# Patient Record
Sex: Female | Born: 1970 | Race: White | Hispanic: No | Marital: Single | Smoking: Current every day smoker
Health system: Southern US, Community
[De-identification: ages and names within clinical notes are randomized; demographics above are authoritative.]

## PROBLEM LIST (undated history)

## (undated) DIAGNOSIS — F329 Major depressive disorder, single episode, unspecified: Secondary | ICD-10-CM

## (undated) DIAGNOSIS — C569 Malignant neoplasm of unspecified ovary: Secondary | ICD-10-CM

## (undated) DIAGNOSIS — F32A Depression, unspecified: Secondary | ICD-10-CM

## (undated) DIAGNOSIS — F419 Anxiety disorder, unspecified: Secondary | ICD-10-CM

## (undated) DIAGNOSIS — F99 Mental disorder, not otherwise specified: Secondary | ICD-10-CM

## (undated) DIAGNOSIS — R12 Heartburn: Secondary | ICD-10-CM

## (undated) DIAGNOSIS — O24419 Gestational diabetes mellitus in pregnancy, unspecified control: Secondary | ICD-10-CM

## (undated) HISTORY — DX: Heartburn: R12

## (undated) HISTORY — DX: Depression, unspecified: F32.A

## (undated) HISTORY — DX: Malignant neoplasm of unspecified ovary: C56.9

## (undated) HISTORY — DX: Major depressive disorder, single episode, unspecified: F32.9

## (undated) HISTORY — DX: Gestational diabetes mellitus in pregnancy, unspecified control: O24.419

---

## 1989-10-25 HISTORY — PX: CERVICAL FUSION: SHX112

## 1992-10-25 DIAGNOSIS — C569 Malignant neoplasm of unspecified ovary: Secondary | ICD-10-CM

## 1992-10-25 HISTORY — DX: Malignant neoplasm of unspecified ovary: C56.9

## 1992-10-25 HISTORY — PX: OVARY SURGERY: SHX727

## 2001-07-07 ENCOUNTER — Encounter: Payer: Self-pay | Admitting: Neurosurgery

## 2001-07-07 ENCOUNTER — Ambulatory Visit (HOSPITAL_COMMUNITY): Admission: RE | Admit: 2001-07-07 | Discharge: 2001-07-07 | Payer: Self-pay | Admitting: Neurosurgery

## 2001-08-22 ENCOUNTER — Encounter: Payer: Self-pay | Admitting: Neurosurgery

## 2001-08-22 ENCOUNTER — Ambulatory Visit (HOSPITAL_COMMUNITY): Admission: RE | Admit: 2001-08-22 | Discharge: 2001-08-22 | Payer: Self-pay | Admitting: Neurosurgery

## 2002-01-01 ENCOUNTER — Encounter: Payer: Self-pay | Admitting: Neurosurgery

## 2002-01-01 ENCOUNTER — Ambulatory Visit (HOSPITAL_COMMUNITY): Admission: RE | Admit: 2002-01-01 | Discharge: 2002-01-01 | Payer: Self-pay | Admitting: Neurosurgery

## 2002-05-23 ENCOUNTER — Emergency Department (HOSPITAL_COMMUNITY): Admission: EM | Admit: 2002-05-23 | Discharge: 2002-05-23 | Payer: Self-pay | Admitting: Emergency Medicine

## 2004-12-24 ENCOUNTER — Ambulatory Visit: Payer: Self-pay | Admitting: Family Medicine

## 2005-02-15 ENCOUNTER — Ambulatory Visit: Payer: Self-pay | Admitting: Family Medicine

## 2005-02-23 ENCOUNTER — Ambulatory Visit: Payer: Self-pay | Admitting: Family Medicine

## 2007-03-09 ENCOUNTER — Ambulatory Visit: Payer: Self-pay | Admitting: Family Medicine

## 2012-12-27 ENCOUNTER — Ambulatory Visit (HOSPITAL_COMMUNITY): Admission: RE | Admit: 2012-12-27 | Payer: BC Managed Care – PPO | Source: Home / Self Care | Admitting: Psychiatry

## 2012-12-27 ENCOUNTER — Ambulatory Visit (INDEPENDENT_AMBULATORY_CARE_PROVIDER_SITE_OTHER): Payer: BC Managed Care – PPO | Admitting: Physician Assistant

## 2012-12-27 DIAGNOSIS — F331 Major depressive disorder, recurrent, moderate: Secondary | ICD-10-CM

## 2012-12-27 MED ORDER — TRAZODONE HCL 50 MG PO TABS: 50.0000 mg | ORAL_TABLET | Freq: Every day | ORAL | Status: DC

## 2012-12-27 NOTE — BHH Counselor (Signed)
Pt presented to [facility] for an assessment but left before being assessed.

## 2013-01-01 ENCOUNTER — Encounter (HOSPITAL_COMMUNITY): Payer: Self-pay | Admitting: *Deleted

## 2013-01-01 ENCOUNTER — Ambulatory Visit (HOSPITAL_COMMUNITY)
Admission: RE | Admit: 2013-01-01 | Discharge: 2013-01-01 | Disposition: A | Discharge status: Home / Self Care | Payer: BC Managed Care – PPO | Source: Ambulatory Visit | Attending: Psychiatry | Admitting: Psychiatry

## 2013-01-01 HISTORY — DX: Anxiety disorder, unspecified: F41.9

## 2013-01-01 HISTORY — DX: Mental disorder, not otherwise specified: F99
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
## 2013-01-16 ENCOUNTER — Other Ambulatory Visit (HOSPITAL_COMMUNITY): Payer: Self-pay | Admitting: Psychiatry

## 2013-01-16 ENCOUNTER — Encounter (HOSPITAL_COMMUNITY): Payer: Self-pay

## 2013-01-16 ENCOUNTER — Other Ambulatory Visit (HOSPITAL_COMMUNITY): Payer: BC Managed Care – PPO | Attending: Psychiatry | Admitting: Psychiatry

## 2013-01-16 DIAGNOSIS — F329 Major depressive disorder, single episode, unspecified: Secondary | ICD-10-CM

## 2013-01-16 DIAGNOSIS — F4310 Post-traumatic stress disorder, unspecified: Secondary | ICD-10-CM

## 2013-01-16 DIAGNOSIS — F32A Depression, unspecified: Secondary | ICD-10-CM

## 2013-01-16 DIAGNOSIS — F411 Generalized anxiety disorder: Secondary | ICD-10-CM

## 2013-01-16 DIAGNOSIS — F332 Major depressive disorder, recurrent severe without psychotic features: Secondary | ICD-10-CM | POA: Insufficient documentation

## 2013-01-16 MED ORDER — MIRTAZAPINE 15 MG PO TABS: 15.0000 mg | ORAL_TABLET | Freq: Every day | ORAL | Status: DC
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
## 2013-01-16 NOTE — Telephone Encounter (Signed)
Pt phoned and stated that pharmacy wouldn't fill Remeron due to wrong address on the script.  This Clinical research associate called in Remeron 15 mg at hs #30 with no refill to pt's pharmacy.

## 2013-01-17 ENCOUNTER — Other Ambulatory Visit (HOSPITAL_COMMUNITY): Payer: BC Managed Care – PPO | Admitting: Psychiatry

## 2013-01-17 ENCOUNTER — Other Ambulatory Visit (HOSPITAL_COMMUNITY): Payer: Self-pay | Admitting: Psychiatry

## 2013-01-17 DIAGNOSIS — F329 Major depressive disorder, single episode, unspecified: Secondary | ICD-10-CM

## 2013-01-17 DIAGNOSIS — F32A Depression, unspecified: Secondary | ICD-10-CM | POA: Insufficient documentation

## 2013-01-17 NOTE — Progress Notes (Signed)
    Daily Group Progress Note  Program: IOP  Group Time: 9:00-10:30 am   Participation Level: Active  Behavioral Response: Appropriate  Type of Therapy:  Process Group  Summary of Progress: Today was patients first day in the group. She appeared nervous but attentive.      Group Time: 10:30 am - 12:00 pm   Participation Level:  Active  Behavioral Response: Appropriate  Type of Therapy: Psycho-education Group  Summary of Progress: Patient participated in an activity to reduce anxiety and practiced Progressive Muscle Relaxation to reduce anxiety symptoms.   Carman Ching, LCSW

## 2013-01-18 ENCOUNTER — Other Ambulatory Visit (HOSPITAL_COMMUNITY): Payer: BC Managed Care – PPO | Admitting: Psychiatry

## 2013-01-18 DIAGNOSIS — F32A Depression, unspecified: Secondary | ICD-10-CM

## 2013-01-18 DIAGNOSIS — F329 Major depressive disorder, single episode, unspecified: Secondary | ICD-10-CM
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
## 2013-01-18 NOTE — Progress Notes (Signed)
    Daily Group Progress Note  Program: IOP  Group Time: 9:00-10:30 am   Participation Level: Active  Behavioral Response: Appropriate  Type of Therapy:  Process Group  Summary of Progress: Patient reports high depression today. She appeared disengaged and was quiet. She appeared to be focused on her own pain and in her own thoughts. She required redirection to join with the group.      Group Time: 10:30 am - 12:00 pm   Participation Level:  Minimal  Behavioral Response: Appropriate  Type of Therapy: Psycho-education Group  Summary of Progress: Patient learned the importance of having a daily wellness list of activities to use to maintain wellness and was introduced to the [employer] list activity.   Carman Ching, LCSW

## 2013-01-19 ENCOUNTER — Other Ambulatory Visit (HOSPITAL_COMMUNITY): Payer: BC Managed Care – PPO | Admitting: Psychiatry

## 2013-01-19 ENCOUNTER — Encounter (HOSPITAL_COMMUNITY): Payer: Self-pay | Admitting: Physician Assistant

## 2013-01-19 DIAGNOSIS — F32A Depression, unspecified: Secondary | ICD-10-CM

## 2013-01-19 DIAGNOSIS — F329 Major depressive disorder, single episode, unspecified: Secondary | ICD-10-CM
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
## 2013-01-22 ENCOUNTER — Other Ambulatory Visit (HOSPITAL_COMMUNITY): Payer: BC Managed Care – PPO | Admitting: Psychiatry

## 2013-01-22 DIAGNOSIS — F32A Depression, unspecified: Secondary | ICD-10-CM

## 2013-01-22 DIAGNOSIS — F329 Major depressive disorder, single episode, unspecified: Secondary | ICD-10-CM

## 2013-01-22 NOTE — Progress Notes (Signed)
    Daily Group Progress Note  Program: IOP  Group Time: 9:00-10:30 am   Participation Level: Minimal  Behavioral Response: Resistant and Disruptive  Type of Therapy:  Process Group  Summary of Progress: Patient interrupted another group member who was in the middle of sharing and lacked insight into this disruption. She shared that she has something from her past that others keep telling her she needs to share but she does not want to. Writer redirected her from using passive aggressive communication and modeled appropriate communication and group social skills. Patient was encouraged to share what and when she is ready.      Group Time: 10:30 am - 12:00 pm   Participation Level:  Active  Behavioral Response: Appropriate  Type of Therapy: Psycho-education Group  Summary of Progress: Patient participated in a group with a focus on grief and loss and identified and shared losses impacting overall wellness.   Carman Ching, LCSW

## 2013-01-23 ENCOUNTER — Other Ambulatory Visit (HOSPITAL_COMMUNITY): Payer: BC Managed Care – PPO | Attending: Psychiatry | Admitting: Psychiatry

## 2013-01-23 DIAGNOSIS — F329 Major depressive disorder, single episode, unspecified: Secondary | ICD-10-CM

## 2013-01-23 DIAGNOSIS — F32A Depression, unspecified: Secondary | ICD-10-CM

## 2013-01-23 DIAGNOSIS — F331 Major depressive disorder, recurrent, moderate: Secondary | ICD-10-CM | POA: Insufficient documentation

## 2013-01-23 DIAGNOSIS — F411 Generalized anxiety disorder: Secondary | ICD-10-CM | POA: Insufficient documentation

## 2013-01-24 ENCOUNTER — Other Ambulatory Visit (HOSPITAL_COMMUNITY): Payer: BC Managed Care – PPO | Admitting: Psychiatry

## 2013-01-24 DIAGNOSIS — F329 Major depressive disorder, single episode, unspecified: Secondary | ICD-10-CM

## 2013-01-24 DIAGNOSIS — F32A Depression, unspecified: Secondary | ICD-10-CM
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
## 2013-01-24 NOTE — Progress Notes (Signed)
    Daily Group Progress Note  Program: IOP  Group Time: 9:00-10:30 am   Participation Level: Active  Behavioral Response: Appropriate  Type of Therapy:  Process Group  Summary of Progress: patient was introduced to how to use progressive muscle relaxation to manage anxiety and depression and patient then practiced how to use the intervention through practice and implementation.      Group Time: 10:30 am - 12:00 pm   Participation Level:  Active  Behavioral Response: Appropriate  Type of Therapy: Psycho-education Group  Summary of Progress: patient was introduced to how to use progressive muscle relaxation to manage anxiety and depression and patient then practiced how to use the intervention through practice and implementation.   Obrien Huskins E, LCSW 

## 2013-01-24 NOTE — Progress Notes (Signed)
    Daily Group Progress Note  Program: IOP  Group Time: 9:00-10:30 am   Participation Level: Active  Behavioral Response: Appropriate  Type of Therapy:  Process Group  Summary of Progress: Patient is wearing makeup and is dressed up with her hair styled. She was smiling and reports feeling "good" today after sharing yesterday and the group and releasing some of her stress with others her supported her. She said she hopes to continue working on trauma from her past. She also described how she is practicing setting healthy boundaries with her family.      Group Time: 10:30 am - 12:00 pm   Participation Level:  Active  Behavioral Response: Appropriate  Type of Therapy: Psycho-education Group  Summary of Progress: Patient participated in a discussion on support groups available through the [facility] that can be continued as part of patients wellness plan.   Carman Ching, LCSW

## 2013-01-25 ENCOUNTER — Other Ambulatory Visit (HOSPITAL_COMMUNITY): Payer: BC Managed Care – PPO

## 2013-01-26 ENCOUNTER — Other Ambulatory Visit (HOSPITAL_COMMUNITY): Payer: BC Managed Care – PPO | Admitting: Psychiatry

## 2013-01-26 DIAGNOSIS — F32A Depression, unspecified: Secondary | ICD-10-CM

## 2013-01-26 DIAGNOSIS — F329 Major depressive disorder, single episode, unspecified: Secondary | ICD-10-CM

## 2013-01-26 NOTE — Progress Notes (Signed)
    Daily Group Progress Note  Program: IOP  Group Time: 9:00-10:30 am   Participation Level: Active  Behavioral Response: Appropriate  Type of Therapy:  Process Group  Summary of Progress: Patient was wearing a dress, high heels and make-up and hair was styled. She was smiling and group members commented on the dramatic change in appearance and mood. Patient reports feeling "good". She states she even has agreed to go on a date today. She is describing how setting boundaries with her family, making time for herself and talking about childhood trauma is helping her heal from symptoms of depression. She talked more today about feeling "rejected" by her mother as a child and how this impacts her life today.      Group Time: 10:30 am - 12:00 pm   Participation Level:  Active  Behavioral Response: Appropriate  Type of Therapy: Psycho-education Group  Summary of Progress: Patient participated in an activity to identify areas of their life that they feel need changes to improve overall wellness and identified one thing to focus on to change in that need area.   Carman Ching, LCSW

## 2013-01-29 ENCOUNTER — Other Ambulatory Visit (HOSPITAL_COMMUNITY): Payer: BC Managed Care – PPO

## 2013-01-29 ENCOUNTER — Telehealth (HOSPITAL_COMMUNITY): Payer: Self-pay | Admitting: Psychiatry

## 2013-01-29 NOTE — Progress Notes (Signed)
Patient ID: Anna Melendez, female   DOB: June 12, 1971, 42 y.o.   MRN: [id number] D:  Faxed completed short term disability forms, along with progress notes to [facility]. RTW on 02-19-13.

## 2013-01-30 ENCOUNTER — Other Ambulatory Visit (HOSPITAL_COMMUNITY): Payer: BC Managed Care – PPO | Admitting: Psychiatry

## 2013-01-30 DIAGNOSIS — F329 Major depressive disorder, single episode, unspecified: Secondary | ICD-10-CM

## 2013-01-30 DIAGNOSIS — F32A Depression, unspecified: Secondary | ICD-10-CM
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
## 2013-01-31 ENCOUNTER — Encounter (HOSPITAL_COMMUNITY): Payer: Self-pay | Admitting: Psychiatry

## 2013-01-31 ENCOUNTER — Other Ambulatory Visit (HOSPITAL_COMMUNITY): Payer: BC Managed Care – PPO | Admitting: Psychiatry

## 2013-01-31 ENCOUNTER — Other Ambulatory Visit (HOSPITAL_COMMUNITY): Payer: Self-pay | Admitting: Psychiatry

## 2013-01-31 VITALS — BP 110/70 | HR 72 | Resp 12 | Wt 163.2 lb

## 2013-01-31 DIAGNOSIS — F32A Depression, unspecified: Secondary | ICD-10-CM

## 2013-01-31 DIAGNOSIS — F411 Generalized anxiety disorder: Secondary | ICD-10-CM

## 2013-01-31 DIAGNOSIS — F329 Major depressive disorder, single episode, unspecified: Secondary | ICD-10-CM

## 2013-01-31 MED ORDER — TRAZODONE HCL 50 MG PO TABS: 50.0000 mg | ORAL_TABLET | Freq: Every day | ORAL | Status: DC

## 2013-01-31 MED ORDER — CLONAZEPAM 0.5 MG PO TABS: 0.5000 mg | ORAL_TABLET | Freq: Two times a day (BID) | ORAL | Status: DC | PRN

## 2013-01-31 MED ORDER — SERTRALINE HCL 50 MG PO TABS: 50.0000 mg | ORAL_TABLET | Freq: Every day | ORAL | Status: DC
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
## 2013-01-31 NOTE — Progress Notes (Signed)
Patient ID: Anna Melendez, female   DOB: September 15, 1971, 42 y.o.   MRN: [id number] D: This is a 42 y.o separated caucasian female, who was referred by Jorje Guild, PA-C.  Pt presented with c/o anxiety and stress. Pt reported daily panic attacks. Pt denies SI,HI, and no AVH reported.  States she is anxious about being discharged.  Improved sleep (6 hrs), fair appetite and concentration.  Continues to struggle with anxiety.  C/O jitteriness.  States that the groups were helpful.  Wants to continue working on setting boundaries. A:  D/C today.  F/U with Forde Radon, LPC on 02-06-13 and Jorje Guild, PA-C on 02-13-13.  RTW on 02-19-13.  Encouraged support groups.  R:  Pt receptive.

## 2013-01-31 NOTE — Patient Instructions (Signed)
Pt completed MH-IOP today.  Will follow up with Forde Radon, LPC on 02-06-13 @ 10 a.m., and Jorje Guild, PA on 02-13-13 @ 2:30pm.  Encouraged support groups.

## 2013-02-01 ENCOUNTER — Other Ambulatory Visit (HOSPITAL_COMMUNITY): Payer: BC Managed Care – PPO

## 2013-02-02 ENCOUNTER — Other Ambulatory Visit (HOSPITAL_COMMUNITY): Payer: BC Managed Care – PPO

## 2013-02-05 ENCOUNTER — Other Ambulatory Visit (HOSPITAL_COMMUNITY): Payer: BC Managed Care – PPO

## 2013-02-06 ENCOUNTER — Other Ambulatory Visit (HOSPITAL_COMMUNITY): Payer: BC Managed Care – PPO

## 2013-02-06 ENCOUNTER — Ambulatory Visit (INDEPENDENT_AMBULATORY_CARE_PROVIDER_SITE_OTHER): Payer: BC Managed Care – PPO | Admitting: Psychology

## 2013-02-06 ENCOUNTER — Encounter (HOSPITAL_COMMUNITY): Payer: Self-pay | Admitting: Psychology

## 2013-02-06 DIAGNOSIS — F329 Major depressive disorder, single episode, unspecified: Secondary | ICD-10-CM

## 2013-02-06 DIAGNOSIS — F32A Depression, unspecified: Secondary | ICD-10-CM

## 2013-02-06 DIAGNOSIS — F411 Generalized anxiety disorder: Secondary | ICD-10-CM
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
## 2013-02-13 ENCOUNTER — Ambulatory Visit (INDEPENDENT_AMBULATORY_CARE_PROVIDER_SITE_OTHER): Payer: BC Managed Care – PPO | Admitting: Physician Assistant

## 2013-02-13 DIAGNOSIS — F329 Major depressive disorder, single episode, unspecified: Secondary | ICD-10-CM

## 2013-02-13 DIAGNOSIS — F411 Generalized anxiety disorder: Secondary | ICD-10-CM

## 2013-02-13 DIAGNOSIS — F32A Depression, unspecified: Secondary | ICD-10-CM

## 2013-02-13 DIAGNOSIS — F331 Major depressive disorder, recurrent, moderate: Secondary | ICD-10-CM

## 2013-02-13 MED ORDER — SERTRALINE HCL 50 MG PO TABS: 75.0000 mg | ORAL_TABLET | Freq: Every day | ORAL | Status: DC

## 2013-02-14 ENCOUNTER — Telehealth (HOSPITAL_COMMUNITY): Payer: Self-pay
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
## 2013-02-27 ENCOUNTER — Ambulatory Visit (HOSPITAL_COMMUNITY): Payer: Self-pay | Admitting: Psychology

## 2013-03-14 ENCOUNTER — Other Ambulatory Visit (HOSPITAL_COMMUNITY): Payer: Self-pay | Admitting: Physician Assistant

## 2013-03-14 ENCOUNTER — Telehealth (HOSPITAL_COMMUNITY): Payer: Self-pay | Admitting: *Deleted

## 2013-03-14 MED ORDER — CLONAZEPAM 0.5 MG PO TABS
0.5000 mg | ORAL_TABLET | Freq: Two times a day (BID) | ORAL | Status: DC | PRN
Start: 2013-03-14 — End: 2013-04-17

## 2013-03-14 NOTE — Telephone Encounter (Signed)
Patient left ZO:[id number] refills of medicine.States she normally doesn't have to come over here, that provider usually calls them in. Contacted patient to find out which medicines need refills. She states all 3 of them - Zoloft, Remeron and Clonazepam Asked pt if Zoloft was helping.  Per provider's note 02/13/13: " She is instructed to call in approximately one month to report on her response to the Zoloft. If improvement is not seen we'll increase her Zoloft to 100 mg daily." Patient states she is feeling okay and does not think she needs to change dosage of Zoloft at this time. States she will discuss with provider at appt 04/25/13  Educated pt regarding calling pharmacy to request refills so that request comes directly to provider. Pt verbalized understanding.

## 2013-03-23 ENCOUNTER — Telehealth (HOSPITAL_COMMUNITY): Payer: Self-pay

## 2013-03-23 ENCOUNTER — Other Ambulatory Visit (HOSPITAL_COMMUNITY): Payer: Self-pay | Admitting: Physician Assistant

## 2013-03-23 NOTE — Telephone Encounter (Signed)
2:23PM 03/23/13 RECD MEDICATION REFILL REQUEST FOR REMERON 15MG  TAB.[facility]

## 2013-03-28 ENCOUNTER — Other Ambulatory Visit (HOSPITAL_COMMUNITY): Payer: Self-pay | Admitting: *Deleted

## 2013-03-28 DIAGNOSIS — F329 Major depressive disorder, single episode, unspecified: Secondary | ICD-10-CM

## 2013-03-28 MED ORDER — MIRTAZAPINE 15 MG PO TABS: 15.0000 mg | ORAL_TABLET | Freq: Every day | ORAL | Status: DC

## 2013-03-28 NOTE — Telephone Encounter (Signed)
Recv'd refill request for Remeron 15 mg. Per EPIC, prescription was printed 01/16/13 for 30 day supply with 9 refills. Per Malachi Bonds at pharmacy, prescription was called in on 3/25 for 30 day supply with 1 refill. Per provider note 4/22, patient is to continue this medication. Refill sent as requested

## 2013-03-29 ENCOUNTER — Ambulatory Visit (HOSPITAL_COMMUNITY): Payer: Self-pay | Admitting: Physician Assistant

## 2013-03-30 ENCOUNTER — Encounter (HOSPITAL_COMMUNITY): Payer: Self-pay | Admitting: Psychology

## 2013-03-30 DIAGNOSIS — F411 Generalized anxiety disorder: Secondary | ICD-10-CM

## 2013-03-30 DIAGNOSIS — F329 Major depressive disorder, single episode, unspecified: Secondary | ICD-10-CM

## 2013-03-30 DIAGNOSIS — F32A Depression, unspecified: Secondary | ICD-10-CM

## 2013-03-30 NOTE — Progress Notes (Signed)
Patient ID: Anna Melendez, female   DOB: Jun 26, 1971, 42 y.o.   MRN: [id number] Outpatient Therapist Discharge Summary  Admission Date: 02/06/13    Discharge Date:  03/30/13  Reason for Discharge:  Didn't return for counseling Diagnosis:   GAD (generalized anxiety disorder)  Depression   Comments:  Pt cancelled f/u. Pt will continue f/u w/ AlanWatt as scheduled.  Forde Radon

## 2013-04-17 ENCOUNTER — Other Ambulatory Visit (HOSPITAL_COMMUNITY): Payer: Self-pay | Admitting: Physician Assistant

## 2013-04-19 ENCOUNTER — Other Ambulatory Visit (HOSPITAL_COMMUNITY): Payer: Self-pay | Admitting: *Deleted

## 2013-04-19 DIAGNOSIS — F411 Generalized anxiety disorder: Secondary | ICD-10-CM

## 2013-04-19 MED ORDER — SERTRALINE HCL 50 MG PO TABS: 75.0000 mg | ORAL_TABLET | Freq: Every day | ORAL | Status: DC

## 2013-04-25 ENCOUNTER — Ambulatory Visit (INDEPENDENT_AMBULATORY_CARE_PROVIDER_SITE_OTHER): Payer: BC Managed Care – PPO | Admitting: Physician Assistant

## 2013-04-25 VITALS — BP 133/83 | HR 75 | Ht 64.0 in | Wt 170.2 lb

## 2013-04-25 DIAGNOSIS — F329 Major depressive disorder, single episode, unspecified: Secondary | ICD-10-CM

## 2013-04-25 DIAGNOSIS — F32A Depression, unspecified: Secondary | ICD-10-CM

## 2013-04-25 DIAGNOSIS — F411 Generalized anxiety disorder: Secondary | ICD-10-CM

## 2013-04-25 MED ORDER — SERTRALINE HCL 100 MG PO TABS: 100.0000 mg | ORAL_TABLET | Freq: Every day | ORAL | Status: DC

## 2013-04-30 ENCOUNTER — Encounter (HOSPITAL_COMMUNITY): Payer: Self-pay | Admitting: Physician Assistant

## 2013-04-30 NOTE — Progress Notes (Signed)
[facility] [id number] Progress Note  Anna Melendez [id number] 42 y.o.  04/25/2013 3:54 PM  Chief Complaint: Anxiety  History of Present Illness: Anna Melendez presents today to followup on her treatment for anxiety and depression. At her last appointment at the end of April, we increased her Zoloft to 75 mg daily. She reports that she is having trouble focusing at work, and experiencing a significant amount of anxiety. She also reports that her sleep is difficult. She reports that she feels lonely. She states that her 39 year old daughter is driving her crazy. She admits to overusing her Klonopin, and even states that she eats it like candy.   Suicidal Ideation: No Plan Formed: No Patient has means to carry out plan: No  Homicidal Ideation: No Plan Formed: No Patient has means to carry out plan: No  Review of Systems: Psychiatric: Agitation: Yes Hallucination: No Depressed Mood: Yes Insomnia: Yes Hypersomnia: No Altered Concentration: Yes Feels Worthless: No Grandiose Ideas: No Belief In Special Powers: No New/Increased Substance Abuse: Yes Compulsions: No  Neurologic: Headache: No Seizure: No Paresthesias: No  Past Medical History:  Past Medical History   Diagnosis  Date   .  Anxiety    .  Mental disorder    .  Depression    .  Gestational diabetes    .  Ovarian cancer  1994    Social History:  Anna Melendez was born in [place], [place], and grew up [employer] [place] [employer]. Her mother separated from her father when she was 52 or 3 years old, and then remarried when she was 5. She has one brother and one sister. She graduated high school. She has been married once, and has 3 children - 2 daughters and one son. She is currently separated from her husband for approximately 6 months. She lives with her 3 children in [place], [place]. 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 Her only  legal difficulties are her divorce. Her social support system consists of 2 friends. Her hobbies include gardening.   Family History:  Family History   Problem  Relation  Age of Onset   .  Bipolar disorder  Father    .  Alcohol abuse  Father    .  Bipolar disorder  Sister    .  Alcohol abuse  Sister    .  Bipolar disorder  Mother    .  Bipolar disorder  Paternal Aunt      Outpatient Encounter Prescriptions as of 04/25/2013  Medication Sig Dispense Refill  . clonazePAM (KLONOPIN) 0.5 MG tablet TAKE ONE TABLET BY MOUTH TWICE DAILY AS NEEDED FOR ANXIETY  60 tablet  0  . mirtazapine (REMERON) 15 MG tablet Take 1 tablet (15 mg total) by mouth at bedtime.  30 tablet  1  . sertraline (ZOLOFT) 100 MG tablet Take 1 tablet (100 mg total) by mouth daily.  30 tablet  1  . [DISCONTINUED] sertraline (ZOLOFT) 50 MG tablet Take 1.5 tablets (75 mg total) by mouth daily.  45 tablet  1   No facility-administered encounter medications on file as of 04/25/2013.    Past Psychiatric History/Hospitalization(s): Anxiety: Yes Bipolar Disorder: No Depression: Yes Mania: No Psychosis: No Schizophrenia: No Personality Disorder: No Hospitalization for psychiatric illness: No History of Electroconvulsive Shock Therapy: No Prior Suicide Attempts: No  Physical Exam: Constitutional:  BP 133/83  Pulse 75  Ht 5\' 4"  (1.626 m)  Wt 170 lb 3.2 oz (77.202 kg)  BMI 29.2 kg/m2  General Appearance: alert, oriented, no acute distress, well nourished and casual  Musculoskeletal: Strength & Muscle Tone: within normal limits Gait & Station: normal Patient leans: N/A  Psychiatric: Speech (describe rate, volume, coherence, spontaneity, and abnormalities if any): Clear and coherent had a regular rate and rhythm and normal volume  Thought Process (describe rate, content, abstract reasoning, and computation): Within normal limits  Associations: Irrelevant, Tangential and Disorganized  Thoughts: obsessions  Mental  Status: Orientation: oriented to person, place, time/date and situation Mood & Affect: anxiety Attention Span & Concentration: Intact  Medical Decision Making (Choose Three): Established Problem, Stable/Improving (1), Review of Psycho-Social Stressors (1) and Review of New Medication or Change in Dosage (2)  Assessment: Axis I: Generalized anxiety disorder, depressive disorder NOS  Axis II: Deferred  Axis III: History of gestational diabetes and ovarian cancer  Axis IV: Moderate  Axis V: 60   Plan: We'll increase her Zoloft to 100 mg daily, and continue her Klonopin 0.5 mg twice daily and Remeron 15 mg at bedtime. She'll return for followup in 2 months. She has been warned not to overuse her Klonopin.  Delany Steury, PA-C 04/30/2013

## 2013-05-10 ENCOUNTER — Other Ambulatory Visit (HOSPITAL_COMMUNITY): Payer: Self-pay | Admitting: *Deleted

## 2013-05-18 ENCOUNTER — Other Ambulatory Visit (HOSPITAL_COMMUNITY): Payer: Self-pay | Admitting: Physician Assistant

## 2013-06-11 ENCOUNTER — Other Ambulatory Visit (HOSPITAL_COMMUNITY): Payer: Self-pay | Admitting: *Deleted

## 2013-06-12 ENCOUNTER — Other Ambulatory Visit (HOSPITAL_COMMUNITY): Payer: Self-pay | Admitting: *Deleted

## 2013-06-12 DIAGNOSIS — F411 Generalized anxiety disorder: Secondary | ICD-10-CM

## 2013-06-12 MED ORDER — SERTRALINE HCL 100 MG PO TABS: 100.0000 mg | ORAL_TABLET | Freq: Every day | ORAL | Status: DC

## 2013-06-12 NOTE — Telephone Encounter (Signed)
Pharmacy had no record of escripted RX from 04/25/13. Called to pharmacy at this time.

## 2013-06-28 ENCOUNTER — Encounter (HOSPITAL_COMMUNITY): Payer: Self-pay | Admitting: Physician Assistant

## 2013-06-28 ENCOUNTER — Ambulatory Visit (INDEPENDENT_AMBULATORY_CARE_PROVIDER_SITE_OTHER): Payer: BC Managed Care – PPO | Admitting: Physician Assistant

## 2013-06-28 VITALS — BP 117/82 | HR 83 | Ht 64.0 in | Wt 177.6 lb

## 2013-06-28 DIAGNOSIS — F32A Depression, unspecified: Secondary | ICD-10-CM

## 2013-06-28 DIAGNOSIS — F329 Major depressive disorder, single episode, unspecified: Secondary | ICD-10-CM

## 2013-06-28 DIAGNOSIS — F411 Generalized anxiety disorder: Secondary | ICD-10-CM

## 2013-07-03 ENCOUNTER — Encounter (HOSPITAL_COMMUNITY): Payer: Self-pay | Admitting: Physician Assistant

## 2013-07-03 ENCOUNTER — Other Ambulatory Visit (HOSPITAL_COMMUNITY): Payer: Self-pay | Admitting: Physician Assistant

## 2013-07-03 NOTE — Progress Notes (Signed)
[facility]al [facility] [id number] Progress Note  Bassy Fetterly [id number] 42 y.o.  06/28/2013 3:15 AM  Chief Complaint: Followup visit for anxiety and depression  History of Present Illness: Anna Melendez presents today to followup on her treatment for anxiety and depression. She reports that her anxiety is much diminished. She reports that she is sleeping well for about 8 hours, and she wakes up refreshed on 15 mg of Remeron. She reports that one night her daughter tried to wake her up when she was sick, and she was unable to be woken. She reports that work is stressful. She reports that she is taking her Klonopin as prescribed, and is no longer taking more than prescribed. She complains that she has been gaining a significant amount of weight recently.  Suicidal Ideation: No Plan Formed: No Patient has means to carry out plan: No  Homicidal Ideation: No Plan Formed: No Patient has means to carry out plan: No  Review of Systems: Psychiatric: Agitation: No Hallucination: No Depressed Mood: No Insomnia: No Hypersomnia: No Altered Concentration: No Feels Worthless: No Grandiose Ideas: No Belief In Special Powers: No New/Increased Substance Abuse: No Compulsions: No  Neurologic: Headache: No Seizure: No Paresthesias: No  Past Medical History:  Past Medical History   Diagnosis  Date   .  Anxiety    .  Mental disorder    .  Depression    .  Gestational diabetes    .  Ovarian cancer  1994    Social History:  Janeliz was born in [place], [place], and grew up [employer] [place] [employer]. Her mother separated from her father when she was 26 or 46 years old, and then remarried when she was 5. She has one brother and one sister. She graduated high school. She has been married once, and has 3 children - 2 daughters and one son. She is currently separated from her husband for approximately 6 months. She lives with her 3 children in [place], [place]. 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 Her only legal difficulties are her divorce. Her social support system consists of 2 friends. Her hobbies include gardening.   Family History:  Family History   Problem  Relation  Age of Onset   .  Bipolar disorder  Father    .  Alcohol abuse  Father    .  Bipolar disorder  Sister    .  Alcohol abuse  Sister    .  Bipolar disorder  Mother    .  Bipolar disorder  Paternal Aunt       Outpatient Encounter Prescriptions as of 06/28/2013  Medication Sig Dispense Refill  . clonazePAM (KLONOPIN) 0.5 MG tablet TAKE ONE TABLET BY MOUTH TWICE DAILY AS NEEDED FOR ANXIETY  60 tablet  0  . mirtazapine (REMERON) 15 MG tablet Take 1 tablet (15 mg total) by mouth at bedtime.  30 tablet  1  . sertraline (ZOLOFT) 100 MG tablet Take 1 tablet (100 mg total) by mouth daily.  30 tablet  1   No facility-administered encounter medications on file as of 06/28/2013.    Past Psychiatric History/Hospitalization(s): Anxiety: Yes Bipolar Disorder: No Depression: Yes Mania: No Psychosis: No Schizophrenia: No Personality Disorder: No Hospitalization for psychiatric illness: No History of Electroconvulsive Shock Therapy: No Prior Suicide Attempts: No  Physical Exam: Constitutional:  BP 117/82  Pulse 83  Ht 5\' 4"  (1.626 m)  Wt 177 lb 9.6 oz (80.559 kg)  BMI 30.47 kg/m2  General Appearance: alert, oriented, no acute distress, well nourished and casually dressed  Musculoskeletal: Strength & Muscle Tone: within normal limits Gait & Station: normal Patient leans: N/A  Psychiatric: Speech (describe rate, volume, coherence, spontaneity, and abnormalities if any): Clear and coherent at a regular rate and rhythm and normal volume.  Thought Process (describe rate, content, abstract reasoning, and computation): Within normal limits  Associations: Intact  Thoughts: normal  Mental Status: Orientation: oriented to  person, place, time/date and situation Mood & Affect: normal affect Attention Span & Concentration: Intact  Medical Decision Making (Choose Three): Established Problem, Stable/Improving (1), Review of Psycho-Social Stressors (1) and Review of New Medication or Change in Dosage (2)  Assessment: Axis I: Generalized anxiety disorder, depressive disorder NOS  Axis II: Deferred  Axis III: History of gestational diabetes and ovarian cancer  Axis IV: Moderate  Axis V: 65   Plan: To try to slow her weight gain, we will try to decrease her Remeron to 7.5 mg and see if she is still able to sleep. If she is able to sleep on 7.5 mg, she can try no Remeron. If cutting down on the dose of Remeron causes problems with sleep, we will consider changing Remeron to trazodone. We will continue her Zoloft at 100 mg daily and Klonopin 0.5 mg twice daily. She will return for followup in 6-8 weeks.  Suann Klier, PA-C 07/03/2013

## 2013-07-06 ENCOUNTER — Other Ambulatory Visit (HOSPITAL_COMMUNITY): Payer: Self-pay | Admitting: Physician Assistant

## 2013-08-15 ENCOUNTER — Other Ambulatory Visit (HOSPITAL_COMMUNITY): Payer: Self-pay | Admitting: Physician Assistant

## 2013-08-15 DIAGNOSIS — F411 Generalized anxiety disorder: Secondary | ICD-10-CM

## 2013-09-04 ENCOUNTER — Other Ambulatory Visit (HOSPITAL_COMMUNITY): Payer: Self-pay | Admitting: Physician Assistant

## 2013-09-04 DIAGNOSIS — F411 Generalized anxiety disorder: Secondary | ICD-10-CM

## 2013-09-04 NOTE — Telephone Encounter (Signed)
Chart reviewed. Refill of Klonopin appropriate at this time. Appointment with Dr.Arfeen 10/30/13.

## 2013-10-09 ENCOUNTER — Other Ambulatory Visit (HOSPITAL_COMMUNITY): Payer: Self-pay | Admitting: *Deleted

## 2013-10-09 DIAGNOSIS — F411 Generalized anxiety disorder: Secondary | ICD-10-CM

## 2013-10-09 MED ORDER — CLONAZEPAM 0.5 MG PO TABS: ORAL_TABLET | ORAL | Status: DC

## 2013-10-09 NOTE — Telephone Encounter (Signed)
Refill request received for Clonazepam. Chart reviewed Refill appropriate Appt with Dr. Lolly Mustache 10/30/13

## 2013-10-24 ENCOUNTER — Other Ambulatory Visit (HOSPITAL_COMMUNITY): Payer: Self-pay | Admitting: Physician Assistant

## 2013-10-24 DIAGNOSIS — F411 Generalized anxiety disorder: Secondary | ICD-10-CM

## 2013-10-24 NOTE — Telephone Encounter (Signed)
Appt 10/30/13 with Dr. Lolly Mustache Chart reviewed Refill Appropriate

## 2013-10-25 ENCOUNTER — Other Ambulatory Visit (HOSPITAL_COMMUNITY): Payer: Self-pay | Admitting: Physician Assistant

## 2013-10-29 ENCOUNTER — Ambulatory Visit (HOSPITAL_COMMUNITY): Payer: Self-pay | Admitting: Physician Assistant

## 2013-10-30 ENCOUNTER — Ambulatory Visit (INDEPENDENT_AMBULATORY_CARE_PROVIDER_SITE_OTHER): Payer: BC Managed Care – PPO | Admitting: Psychiatry

## 2013-10-30 ENCOUNTER — Encounter (HOSPITAL_COMMUNITY): Payer: Self-pay | Admitting: Psychiatry

## 2013-10-30 VITALS — BP 125/86 | HR 80 | Ht 64.0 in | Wt 189.9 lb

## 2013-10-30 DIAGNOSIS — F411 Generalized anxiety disorder: Secondary | ICD-10-CM

## 2013-10-30 DIAGNOSIS — F329 Major depressive disorder, single episode, unspecified: Secondary | ICD-10-CM

## 2013-10-30 DIAGNOSIS — F3289 Other specified depressive episodes: Secondary | ICD-10-CM

## 2013-10-30 MED ORDER — SERTRALINE HCL 100 MG PO TABS: ORAL_TABLET | ORAL | Status: DC

## 2013-10-30 MED ORDER — LAMOTRIGINE 25 MG PO TABS: ORAL_TABLET | ORAL | Status: DC

## 2013-10-30 NOTE — Progress Notes (Signed)
[facility] [phone] Progress Note  Bradlee Bridgers [id number] 43 y.o.  06/28/2013 3:15 AM  Chief Complaint:  My medicine is not working.  I'm gaining weight.    History of Present Illness:  Lasheena is 43 year old Caucasian , employed separated female who has been seen in this office by physician assistant who left the practice .  Patient is very depressed and irritable.  She is not taking Zoloft because it was not filled at pharmacies.  She is taking Remeron 7.5 mg at bedtime but she is very upset because she has gained weight.  She stopped taking Klonopin because it was not helping her mood.  Patient endorsed a lot of stress at work.  She is feeling very irritable, agitated and having issues with her 64 year old daughter and also at work.  She sleeping on and off.  She endorsed social isolation and some time hopeless but denies any suicidal thoughts or homicidal thoughts.  She feels sometime that she should quit her job however she has no other choice but to work .  She has 3 children.  She is currently separated from her husband .  It is unclear why she is not taking Zoloft .  She told pharmacy did not refill her prescription and she's been held for more than 3 weeks.  She forgot to call us to inquire about her refill .  She's not drinking or using any illegal substances.  She's afraid about her agitation anger and severe mood swings.  She admitted sometime having crying spells but denies any active or passive suicidal thoughts or homicidal thoughts.    Suicidal Ideation: No Plan Formed: No Patient has means to carry out plan: No  Homicidal Ideation: No Plan Formed: No Patient has means to carry out plan: No  Review of Systems: Psychiatric: Agitation: Yes Hallucination: No Depressed Mood: Yes Insomnia: Yes Hypersomnia: No Altered Concentration: No Feels Worthless: Yes Grandiose Ideas: No Belief In Special Powers: No New/Increased Substance Abuse: No Compulsions:  No  Neurologic: Headache: No Seizure: No Paresthesias: No  Past Medical History:  Past Medical History   Diagnosis  Date   .  Anxiety    .  Mental disorder    .  Depression    .  Gestational diabetes    .  Ovarian cancer  1994    Social History:  Sharnetta was born in [place], [place], and grew up [employer] [place] [employer]. Her mother separated from her father when she was 48 or 3 years old, and then remarried when she was 89. She has one brother and one sister. She graduated high school. She has been married once, and has 3 children - 2 daughters and one son. She is currently separated from her husband for approximately 6 months. She lives with her 3 children in [place], [place]. 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 Her only legal difficulties are her divorce. Her social support system consists of 2 friends. Her hobbies include gardening.   Family History:  Family History   Problem  Relation  Age of Onset   .  Bipolar disorder  Father    .  Alcohol abuse  Father    .  Bipolar disorder  Sister    .  Alcohol abuse  Sister    .  Bipolar disorder  Mother    .  Bipolar disorder  Paternal Aunt  Outpatient Encounter Prescriptions as of 10/30/2013  Medication Sig  . lamoTRIgine (LAMICTAL) 25 MG tablet Take 1 tab daily for 1 week and than 2 tab daily  . sertraline (ZOLOFT) 100 MG tablet TAKE ONE TABLET BY MOUTH ONCE DAILY  . [DISCONTINUED] clonazePAM (KLONOPIN) 0.5 MG tablet TAKE ONE TABLET BY MOUTH TWICE DAILY AS NEEDED FOR ANXIETY  . [DISCONTINUED] mirtazapine (REMERON) 15 MG tablet Take 1 tablet (15 mg total) by mouth at bedtime.  . [DISCONTINUED] sertraline (ZOLOFT) 100 MG tablet TAKE ONE TABLET BY MOUTH ONCE DAILY    Past Psychiatric History/Hospitalization(s): Patient has intensive outpatient program.  In the past she had tried Wellbutrin.  She was given trazodone and Zoloft when  she was in intensive outpatient program.  Later it was added Remeron.  Patient has history of heavy drinking.  She claims to be sober for past 3 months. Anxiety: Yes Bipolar Disorder: No Depression: Yes Mania: No Psychosis: No Schizophrenia: No Personality Disorder: No Hospitalization for psychiatric illness: No History of Electroconvulsive Shock Therapy: No Prior Suicide Attempts: No  Physical Exam: Constitutional:  BP 125/86  Pulse 80  Ht 5\' 4"  (1.626 m)  Wt 189 lb 14.4 oz (86.138 kg)  BMI 32.58 kg/m2  General Appearance: well nourished, obese and Tearful and irritable  Musculoskeletal: Strength & Muscle Tone: within normal limits Gait & Station: normal Patient leans: N/A  Psychiatric: Speech (describe rate, volume, coherence, spontaneity, and abnormalities if any): Fast but clear and coherent.  Pressured at times .    Thought Process (describe rate, content, abstract reasoning, and computation): Within normal limits  Associations: Circumstantial and Intact  Thoughts: Irritable  Mental Status: Orientation: oriented to person, place, time/date and situation Mood & Affect: normal affect Attention Span & Concentration: Fair.    Medical Decision Making (Choose Three): Established Problem, Stable/Improving (1), Review of Psycho-Social Stressors (1), Decision to obtain old records (1), Review and summation of old records (2), Established Problem, Worsening (2), Review of Last Therapy Session (1), Review of Medication Regimen & Side Effects (2) and Review of New Medication or Change in Dosage (2)  Assessment: Axis I: Disorder NOS, depressive disorder NOS  Axis II: Deferred  Axis III: History of gestational diabetes and ovarian cancer  Axis IV: Moderate  Axis V: 65   Plan:  I review her records, psychosocial stressors, current medication .  Patient is not taking Zoloft .  She is now taking Klonopin.  She does not feel Remeron is helping him actually causing weight  gain.  She has gained weight since started Remeron.  I recommend to discontinue Remeron.  I recommend to restart Zoloft and try Lamictal 25 mg daily for one week and gradually increase to 50 mg daily.  I will discontinue Klonopin since patient is not taking.  Recommend to see counselor for coping skills.  Discussed in detail the risks and benefits of medication.  Discussed rash with Lamictal and in that case she needs to stop the medication immediately.  Followup in 2 weeks.  Time spent 25 minutes.  More than 50% of the time spent in psychoeducation, counseling and coordination of care.  Discuss safety plan that anytime having active suicidal thoughts or homicidal thoughts then patient need to call 911 or go to the local emergency room.  ARFEEN,SYED T., MD 10/30/2013

## 2013-11-05 ENCOUNTER — Telehealth (HOSPITAL_COMMUNITY): Payer: Self-pay

## 2013-11-05 NOTE — Telephone Encounter (Signed)
Contacted pt at Dr. Marguerite Olea request.Left message on cell and home to call office in AM to clarify which medication stopped

## 2013-11-06 ENCOUNTER — Telehealth (HOSPITAL_COMMUNITY): Payer: Self-pay | Admitting: Psychiatry

## 2013-11-06 NOTE — Telephone Encounter (Signed)
I returned patient's phone call.  She has a reaction that Lamictal.  She does not have a rash about felt significant itching, dizziness, diarrhea and chills.  She stopped Lamictal and feeling better.  She is wondering about trying a different medication however I recommended until her symptoms completely resolved we will defer any medication.  I recommend to call us back next week.

## 2013-11-13 ENCOUNTER — Encounter (HOSPITAL_COMMUNITY): Payer: Self-pay | Admitting: Psychiatry

## 2013-11-13 ENCOUNTER — Ambulatory Visit (INDEPENDENT_AMBULATORY_CARE_PROVIDER_SITE_OTHER): Payer: BC Managed Care – PPO | Admitting: Psychiatry

## 2013-11-13 VITALS — BP 146/85 | HR 73 | Ht 64.0 in | Wt 191.6 lb

## 2013-11-13 DIAGNOSIS — F411 Generalized anxiety disorder: Secondary | ICD-10-CM

## 2013-11-13 MED ORDER — SERTRALINE HCL 100 MG PO TABS: ORAL_TABLET | ORAL | Status: DC

## 2013-11-13 MED ORDER — QUETIAPINE FUMARATE 25 MG PO TABS: ORAL_TABLET | ORAL | Status: DC

## 2013-11-13 NOTE — Progress Notes (Signed)
[facility] Progress Note  Anna Melendez [id number] 43 y.o.  06/28/2013 3:15 AM  Chief Complaint:  I stop taking Lamictal because it was giving me side effects.      History of Present Illness:  Anna Melendez i came for her followup appointment.  We have prescribed Lamictal on her last visit the patient called [place] back complaining of nausea, dizziness and having side effects.  She was recommended to stop Lamictal .  Patient feeling much better since then however she continues to have irritability, anger and mood swing.  Her Remeron was discontinued because patient was complaining of weight gain.  Her weight remains concerned and she continues to gain weight .  Patient now believes that her weight gain is nothing to do with the medication.  She wants to try a different medication to help her mood lability anger and sleep.  She lives near [place] I like to be a followup in that area.  She is scheduled to see a therapist in this office however appointment was canceled because therapist was busy .  Patient is compliant with Zoloft.  She lives with her 3 children.  Currently she is separated from her husband and endorsed irritability anger and mood swings.  She denies any active or passive suicidal thoughts or homicidal thoughts.  Suicidal Ideation: No Plan Formed: No Patient has means to carry out plan: No  Homicidal Ideation: No Plan Formed: No Patient has means to carry out plan: No  Review of Systems: Psychiatric: Agitation: Yes Hallucination: No Depressed Mood: Yes Insomnia: Yes Hypersomnia: No Altered Concentration: No Feels Worthless: No Grandiose Ideas: No Belief In Special Powers: No New/Increased Substance Abuse: No Compulsions: No  Neurologic: Headache: No Seizure: No Paresthesias: No  Past Medical History:  Past Medical History   Diagnosis  Date   .  Anxiety    .  Mental disorder    .  Depression    .  Gestational diabetes    .  Ovarian cancer  1994     Social History:  Anna Melendez was born in [place], [place], and grew up [employer] [place] [employer]. Her mother separated from her father when she was 87 or 10 years old, and then remarried when she was 46. She has one brother and one sister. She graduated high school. She has been married once, and has 3 children - 2 daughters and one son. She is currently separated from her husband for approximately 6 months. She lives with her 3 children in [place], [place]. 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 Her only legal difficulties are her divorce. Her social support system consists of 2 friends. Her hobbies include gardening.   Family History:  Family History   Problem  Relation  Age of Onset   .  Bipolar disorder  Father    .  Alcohol abuse  Father    .  Bipolar disorder  Sister    .  Alcohol abuse  Sister    .  Bipolar disorder  Mother    .  Bipolar disorder  Paternal Aunt       Outpatient Encounter Prescriptions as of 11/13/2013  Medication Sig  . sertraline (ZOLOFT) 100 MG tablet TAKE ONE TABLET BY MOUTH ONCE DAILY  . [DISCONTINUED] sertraline (ZOLOFT) 100 MG tablet TAKE ONE TABLET BY MOUTH ONCE DAILY  . QUEtiapine (SEROQUEL) 25 MG tablet Take 1-2 tab at  bed time  . [DISCONTINUED] lamoTRIgine (LAMICTAL) 25 MG tablet Take 1 tab daily for 1 week and than 2 tab daily    Past Psychiatric History/Hospitalization(s): Patient has intensive outpatient program.  In the past she had tried Wellbutrin.  She was given trazodone and Zoloft when she was in intensive outpatient program.  Later it was added Remeron.  Patient has history of heavy drinking.  She claims to be sober for past 3 months. Anxiety: Yes Bipolar Disorder: No Depression: Yes Mania: No Psychosis: No Schizophrenia: No Personality Disorder: No Hospitalization for psychiatric illness: No History of Electroconvulsive Shock Therapy: No Prior  Suicide Attempts: No  Physical Exam: Constitutional:  BP 146/85  Pulse 73  Ht 5\' 4"  (1.626 m)  Wt 191 lb 9.6 oz (86.909 kg)  BMI 32.87 kg/m2  General Appearance: well nourished, obese and Tearful and irritable  Musculoskeletal: Strength & Muscle Tone: within normal limits Gait & Station: normal Patient leans: N/A  Psychiatric: Speech (describe rate, volume, coherence, spontaneity, and abnormalities if any): Fast but clear and coherent.  Pressured at times .    Thought Process (describe rate, content, abstract reasoning, and computation): Within normal limits  Associations: Circumstantial and Intact  Thoughts: Irritable  Mental Status: Orientation: oriented to person, place, time/date and situation Mood & Affect: normal affect Attention Span & Concentration: Fair.    Medical Decision Making (Choose Three): Established Problem, Stable/Improving (1), Review of Psycho-Social Stressors (1), Review of Last Therapy Session (1), Review of Medication Regimen & Side Effects (2) and Review of New Medication or Change in Dosage (2)  Assessment: Axis I: Disorder NOS, depressive disorder NOS  Axis II: Deferred  Axis III: History of gestational diabetes and ovarian cancer  Axis IV: Moderate  Axis V: 65   Plan:  Discontinued Lamictal.  Patient already stopped the Lamictal.  I recommend to try Seroquel 25 mg 1-2 tablets at bedtime to help mood lability racing thoughts and insomnia.  I have a long discussion about weight gain .  I strongly recommended to watch her calorie intake and whatever exercise .  Continue Zoloft at present dose.  I will schedule the appointment in [place] office.  Once she is stable on psychotropic medication patient liked a followup in [place] office.  Recommend to call us back if she has any question or any concern.  Followup in 3-4 weeks.  More than 50% of the time spent in psychoeducation, counseling and coordination of care.  Discuss safety plan that  anytime having active suicidal thoughts or homicidal thoughts then patient need to call 911 or go to the local emergency room.  Zayd Bonet T., MD 11/13/2013

## 2013-11-15 ENCOUNTER — Ambulatory Visit (HOSPITAL_COMMUNITY): Payer: Self-pay | Admitting: Psychiatry

## 2013-12-03 ENCOUNTER — Telehealth (HOSPITAL_COMMUNITY): Payer: Self-pay | Admitting: *Deleted

## 2013-12-03 NOTE — Telephone Encounter (Signed)
See phone note

## 2013-12-04 ENCOUNTER — Ambulatory Visit (INDEPENDENT_AMBULATORY_CARE_PROVIDER_SITE_OTHER): Payer: BC Managed Care – PPO | Admitting: Psychiatry

## 2013-12-04 ENCOUNTER — Encounter (HOSPITAL_COMMUNITY): Payer: Self-pay | Admitting: Psychiatry

## 2013-12-04 VITALS — BP 135/85 | HR 76 | Ht 64.0 in | Wt 188.0 lb

## 2013-12-04 DIAGNOSIS — F3289 Other specified depressive episodes: Secondary | ICD-10-CM

## 2013-12-04 DIAGNOSIS — F329 Major depressive disorder, single episode, unspecified: Secondary | ICD-10-CM

## 2013-12-04 DIAGNOSIS — F411 Generalized anxiety disorder: Secondary | ICD-10-CM

## 2013-12-04 MED ORDER — SERTRALINE HCL 100 MG PO TABS: ORAL_TABLET | ORAL | Status: DC

## 2013-12-04 MED ORDER — QUETIAPINE FUMARATE 50 MG PO TABS: 50.0000 mg | ORAL_TABLET | Freq: Every day | ORAL | Status: DC

## 2013-12-04 NOTE — Progress Notes (Addendum)
[facility] Progress Note  Anna Melendez [id number] 43 y.o.  06/28/2013 3:15 AM  Chief Complaint:  I am not happy with [facility].  I'm out of Zoloft.  I'm feeling tired.  I cannot sleep.        History of Present Illness:  Anna Melendez came for her followup appointment.  She is very upset on [facility] because she was not given Zoloft and she has been out for now 7 days .  She is complaining of irritability, anger and poor sleep.  She remembers her colon solo working very well for her.  She was able to sleep.  I called [facility] and I was told to hold her for the pharmacist , however after 10 minutes the pharmacist did not come on the phone.  Patient denies any side effects of medication.  She has no tremors, shakes or any other concern.  She believes she lost weight however she is afraid to go on a scale.  She wants to continue Zoloft and Seroquel.  She is taking Seroquel 50 mg at bedtime.  She denies any hallucination, paranoia or any suicidal thoughts.  Suicidal Ideation: No Plan Formed: No Patient has means to carry out plan: No  Homicidal Ideation: No Plan Formed: No Patient has means to carry out plan: No  Review of Systems: Psychiatric: Agitation: Yes Hallucination: No Depressed Mood: Yes Insomnia: Yes Hypersomnia: No Altered Concentration: No Feels Worthless: No Grandiose Ideas: No Belief In Special Powers: No New/Increased Substance Abuse: No Compulsions: No  Neurologic: Headache: No Seizure: No Paresthesias: No  Past Medical History:  Past Medical History   Diagnosis  Date   .  Anxiety    .  Mental disorder    .  Depression    .  Gestational diabetes    .  Ovarian cancer  1994    Social History:  Anna Melendez was born in [place], [place], and grew up [employer] [place] [employer]. Her mother separated from her father when she was 25 or 45 years old, and then remarried when she was 8. She has one brother and one sister. She graduated high  school. She has been married once, and has 3 children - 2 daughters and one son. She is currently separated from her husband for approximately 6 months. She lives with her 3 children in [place], [place]. 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 Her only legal difficulties are her divorce. Her social support system consists of 2 friends. Her hobbies include gardening.   Family History:  Family History   Problem  Relation  Age of Onset   .  Bipolar disorder  Father    .  Alcohol abuse  Father    .  Bipolar disorder  Sister    .  Alcohol abuse  Sister    .  Bipolar disorder  Mother    .  Bipolar disorder  Paternal Aunt       Outpatient Encounter Prescriptions as of 12/04/2013  Medication Sig  . QUEtiapine (SEROQUEL) 50 MG tablet Take 1 tablet (50 mg total) by mouth at bedtime.  . sertraline (ZOLOFT) 100 MG tablet TAKE ONE TABLET BY MOUTH ONCE DAILY  . [DISCONTINUED] QUEtiapine (SEROQUEL) 25 MG tablet Take 1-2 tab at bed time  . [DISCONTINUED] sertraline (ZOLOFT) 100 MG tablet TAKE ONE TABLET BY MOUTH ONCE DAILY    Past Psychiatric History/Hospitalization(s): Patient has intensive outpatient  program.  In the past she had tried Wellbutrin.  She was given trazodone and Zoloft when she was in intensive outpatient program.  Later it was added Remeron.  Patient has history of heavy drinking.  She claims to be sober for past 3 months. Anxiety: Yes Bipolar Disorder: No Depression: Yes Mania: No Psychosis: No Schizophrenia: No Personality Disorder: No Hospitalization for psychiatric illness: No History of Electroconvulsive Shock Therapy: No Prior Suicide Attempts: No  Physical Exam: Constitutional:  BP 135/85  Pulse 76  Ht 5\' 4"  (1.626 m)  Wt 188 lb (85.276 kg)  BMI 32.25 kg/m2  General Appearance: well nourished and obese  Musculoskeletal: Strength & Muscle Tone: within normal limits Gait & Station:  normal Patient leans: N/A  Psychiatric: Speech (describe rate, volume, coherence, spontaneity, and abnormalities if any): Fast but clear and coherent.  Pressured at times .    Thought Process (describe rate, content, abstract reasoning, and computation): Within normal limits  Associations: Intact, her fund of knowledge is adequate  Thoughts: Irritable  Mental Status: Orientation: oriented to person, place, time/date and situation Mood & Affect: normal affect Attention Span & Concentration: Fair.    Medical Decision Making (Choose Three): Established Problem, Stable/Improving (1), Review of Psycho-Social Stressors (1), Review of Last Therapy Session (1) and Review of Medication Regimen & Side Effects (2)  Assessment: Axis I: Disorder NOS, depressive disorder NOS  Axis II: Deferred  Axis III: History of gestational diabetes and ovarian cancer  Axis IV: Moderate  Axis V: 65   Plan:  I recommend to continue Seroquel 50 mg at bedtime and Zoloft 100 mg daily.  I will provide a hard copy so she can  Try a d recommended to call us back if she has any question or any concern.  Followup in 2 months.  ARFEEN,SYED T., MD 12/04/2013

## 2014-02-05 ENCOUNTER — Ambulatory Visit (HOSPITAL_COMMUNITY): Payer: Self-pay | Admitting: Psychiatry

## 2014-02-19 ENCOUNTER — Ambulatory Visit (INDEPENDENT_AMBULATORY_CARE_PROVIDER_SITE_OTHER): Payer: BC Managed Care – PPO | Admitting: Psychiatry

## 2014-02-19 ENCOUNTER — Encounter (HOSPITAL_COMMUNITY): Payer: Self-pay | Admitting: Psychiatry

## 2014-02-19 VITALS — BP 137/60 | HR 66 | Ht 64.0 in | Wt 190.8 lb

## 2014-02-19 DIAGNOSIS — F411 Generalized anxiety disorder: Secondary | ICD-10-CM

## 2014-02-19 DIAGNOSIS — F329 Major depressive disorder, single episode, unspecified: Secondary | ICD-10-CM

## 2014-02-19 DIAGNOSIS — F3289 Other specified depressive episodes: Secondary | ICD-10-CM

## 2014-02-19 MED ORDER — QUETIAPINE FUMARATE 50 MG PO TABS: 50.0000 mg | ORAL_TABLET | Freq: Every day | ORAL | Status: DC

## 2014-02-19 MED ORDER — SERTRALINE HCL 100 MG PO TABS: ORAL_TABLET | ORAL | Status: DC

## 2014-02-19 NOTE — Progress Notes (Signed)
[facility] Progress Note  Anna Melendez [id number] 43 y.o.  06/28/2013 3:15 AM  Chief Complaint:  I am doing much better with medication.        History of Present Illness:  Anna Melendez came for her followup appointment.  She has changed her pharmacy and she is getting the medication on time.  She is feeling much better with the medication.  Recently her mother moved in with her because her sister refused to keep her.  Patient told her mother was not happy with her because her sister was argument with the mother on a daily basis.  She also told that mother has been malnourished .  She is taking care of her mother.  She is finding a new place for her so she can live by herself.  Patient denies any recent irritability, anger, mood swing.  She sleeping better.  She denied education.  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  Patient denies any tremors or shakes.  She denies any hallucination or any paranoia.  She has gained weight because she is cooking every day for her mother.  She is not drinking or using any illegal substances.  She wants to continue Zoloft and Seroquel.   Suicidal Ideation: No Plan Formed: No Patient has means to carry out plan: No  Homicidal Ideation: No Plan Formed: No Patient has means to carry out plan: No  Review of Systems: Psychiatric: Agitation: No Hallucination: No Depressed Mood: No Insomnia: No Hypersomnia: No Altered Concentration: No Feels Worthless: No Grandiose Ideas: No Belief In Special Powers: No New/Increased Substance Abuse: No Compulsions: No  Neurologic: Headache: No Seizure: No Paresthesias: No  Past Medical History:  Patient has history of Ovarian cancer.  [facility]  She is seeing Dr. Edrick Oh.  Social History:  Anna Melendez was born in [place], [place], and grew up [employer]. Her mother separated from her father when she was 70 or 66 years  old, and then remarried when she was 2. She has one brother and one sister. She graduated high school. She has been married once, and has 3 children - 2 daughters and one son. She is separated from her husband.   Family History:  Family History   Problem  Relation  Age of Onset   .  Bipolar disorder  Father    .  Alcohol abuse  Father    .  Bipolar disorder  Sister    .  Alcohol abuse  Sister    .  Bipolar disorder  Mother    .  Bipolar disorder  Paternal Aunt       Outpatient Encounter Prescriptions as of 02/19/2014  Medication Sig  . QUEtiapine (SEROQUEL) 50 MG tablet Take 1 tablet (50 mg total) by mouth at bedtime.  . sertraline (ZOLOFT) 100 MG tablet TAKE ONE TABLET BY MOUTH ONCE DAILY  . [DISCONTINUED] QUEtiapine (SEROQUEL) 50 MG tablet Take 1 tablet (50 mg total) by mouth at bedtime.  . [DISCONTINUED] sertraline (ZOLOFT) 100 MG tablet TAKE ONE TABLET BY MOUTH ONCE DAILY    Past Psychiatric History/Hospitalization(s): Patient has intensive outpatient program.  In the past she had tried Wellbutrin.  She was given trazodone and Zoloft when she was in intensive outpatient program.  Later it was added Remeron.  Patient has history of heavy drinking.   Anxiety: Yes Bipolar Disorder: No Depression: Yes Mania: No Psychosis: No Schizophrenia: No Personality Disorder: No  Hospitalization for psychiatric illness: No History of Electroconvulsive Shock Therapy: No Prior Suicide Attempts: No  Physical Exam: Constitutional:  BP 137/60  Pulse 66  Ht 5\' 4"  (1.626 m)  Wt 190 lb 12.8 oz (86.546 kg)  BMI 32.73 kg/m2  General Appearance: well nourished and obese  Musculoskeletal: Strength & Muscle Tone: within normal limits Gait & Station: normal Patient leans: N/A  Psychiatric: Speech (describe rate, volume, coherence, spontaneity, and abnormalities if any): Fast but clear and coherent.      Thought Process (describe rate, content, abstract reasoning, and computation): Within  normal limits  Associations: Intact, her fund of knowledge is adequate  Thoughts: normal  Mental Status: Orientation: oriented to person, place, time/date and situation Mood & Affect: normal affect Attention Span & Concentration: Fair.    Established Problem, Stable/Improving (1), Review of Psycho-Social Stressors (1), Review of Last Therapy Session (1) and Review of Medication Regimen & Side Effects (2)  Assessment: Axis I: Disorder NOS, depressive disorder NOS  Axis II: Deferred  Axis III: History of gestational diabetes and ovarian cancer  Axis IV: Moderate  Axis V: 65   Plan:  Patient is doing better on her current psychotropic medication.  I will continue Seroquel 50 mg at bedtime and Zoloft 100 mg daily.  Recommended to call us back if she has any question or any concern.  Followup in 3 months.  Ashanty Coltrane T., MD 02/19/2014

## 2014-05-22 ENCOUNTER — Encounter (HOSPITAL_COMMUNITY): Payer: Self-pay | Admitting: Psychiatry

## 2014-05-22 ENCOUNTER — Ambulatory Visit (INDEPENDENT_AMBULATORY_CARE_PROVIDER_SITE_OTHER): Payer: BC Managed Care – PPO | Admitting: Psychiatry

## 2014-05-22 VITALS — BP 143/89 | HR 90 | Ht 64.0 in | Wt 194.0 lb

## 2014-05-22 DIAGNOSIS — F411 Generalized anxiety disorder: Secondary | ICD-10-CM

## 2014-05-22 DIAGNOSIS — Z79899 Other long term (current) drug therapy: Secondary | ICD-10-CM

## 2014-05-22 MED ORDER — SERTRALINE HCL 100 MG PO TABS: ORAL_TABLET | ORAL | Status: DC

## 2014-05-22 MED ORDER — QUETIAPINE FUMARATE 50 MG PO TABS: 50.0000 mg | ORAL_TABLET | Freq: Every day | ORAL | Status: DC

## 2014-05-22 NOTE — Progress Notes (Addendum)
[facility] [phone] Progress Note  Anna Melendez [id number] 43 y.o.  Chief Complaint:  Medication management and followup.        History of Present Illness:  Anna Melendez came for her followup appointment.  She is taking her medication as prescribed.  She is happy because her mother moved out to a retirement facility.  She is seen her mother every day.  She admitted to her stress level is less from pass.  She sleeping good.  She went to beach with her children and her ex-husband and she had a good time.  Patient admitted that she is not living with her husband and they still living separate but got more friends and help each other.  Patient denies any agitation, anger, mood swing.  Her appetite is okay.  She denies any paranoia or any of his admission.  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  Patient denies any tremors or shakes.  Her vitals are stable.  Her weight is unchanged from the past.  She is taking Zoloft 100 mg daily and quetiapine 50 mg at bedtime.  She has no blood work in a while.  Her primary care physician at [facility] .  Patient lives with her 3 children.  Her older daughter is graduating this year.  She is 44 years old.  Patient is separated from her husband.  Suicidal Ideation: No Plan Formed: No Patient has means to carry out plan: No  Homicidal Ideation: No Plan Formed: No Patient has means to carry out plan: No  Review of Systems: Psychiatric: Agitation: No Hallucination: No Depressed Mood: No Insomnia: No Hypersomnia: No Altered Concentration: No Feels Worthless: No Grandiose Ideas: No Belief In Special Powers: No New/Increased Substance Abuse: No Compulsions: No  Neurologic: Headache: No Seizure: No Paresthesias: No  Past Medical History:  Patient has history of Ovarian cancer.  Her [facility].  She is seeing Dr. Edrick Oh.   Outpatient Encounter Prescriptions as of 05/22/2014  Medication Sig  . QUEtiapine  (SEROQUEL) 50 MG tablet Take 1 tablet (50 mg total) by mouth at bedtime.  . sertraline (ZOLOFT) 100 MG tablet TAKE ONE TABLET BY MOUTH ONCE DAILY  . [DISCONTINUED] QUEtiapine (SEROQUEL) 50 MG tablet Take 1 tablet (50 mg total) by mouth at bedtime.  . [DISCONTINUED] sertraline (ZOLOFT) 100 MG tablet TAKE ONE TABLET BY MOUTH ONCE DAILY    Past Psychiatric History/Hospitalization(s): Patient has intensive outpatient program.  In the past she had tried Wellbutrin.  She was given trazodone and Zoloft when she was in intensive outpatient program.  Later it was added Remeron.  Patient has history of heavy drinking.   Anxiety: Yes Bipolar Disorder: No Depression: Yes Mania: No Psychosis: No Schizophrenia: No Personality Disorder: No Hospitalization for psychiatric illness: No History of Electroconvulsive Shock Therapy: No Prior Suicide Attempts: No  Physical Exam: Constitutional:  BP 143/89  Pulse 90  Ht 5\' 4"  (1.626 m)  Wt 194 lb (87.998 kg)  BMI 33.28 kg/m2  General Appearance: well nourished and obese  Musculoskeletal: Strength & Muscle Tone: within normal limits Gait & Station: normal Patient leans: N/A  Psychiatric: Speech (describe rate, volume, coherence, spontaneity, and abnormalities if any): Fast but clear and coherent.      Thought Process (describe rate, content, abstract reasoning, and computation): Within normal limits  Associations: Intact, her fund of knowledge is adequate  Thoughts: normal  Mental Status: Orientation: oriented to person, place, time/date and situation Mood & Affect: normal  affect Attention Span & Concentration: Fair.    Established Problem, Stable/Improving (1), Review of Psycho-Social Stressors (1), Review or order clinical lab tests (1), Review of Last Therapy Session (1) and Review of Medication Regimen & Side Effects (2)  Assessment: Axis I: Disorder NOS, depressive disorder NOS  Axis II: Deferred  Axis III: History of gestational  diabetes and ovarian cancer  Axis IV: Moderate  Axis V: 65   Plan:  Patient is doing better on her current psychotropic medication.  I will continue Seroquel 50 mg at bedtime and Zoloft 100 mg daily.  I will also do blood work which has not done in a while.  I will order CBC, CMP, hemoglobin A1c, TSH, lipid panel .  Recommended to call us back if she has any question or any concern.  Followup in 3 months.  Elie Leppo T., MD 05/22/2014

## 2014-08-22 ENCOUNTER — Ambulatory Visit (HOSPITAL_COMMUNITY): Payer: Self-pay | Admitting: Psychiatry

## 2014-09-02 ENCOUNTER — Ambulatory Visit (INDEPENDENT_AMBULATORY_CARE_PROVIDER_SITE_OTHER): Payer: BC Managed Care – PPO | Admitting: Psychiatry

## 2014-09-02 ENCOUNTER — Encounter (HOSPITAL_COMMUNITY): Payer: Self-pay | Admitting: Psychiatry

## 2014-09-02 VITALS — BP 139/75 | HR 69 | Ht 64.0 in | Wt 202.2 lb

## 2014-09-02 DIAGNOSIS — F411 Generalized anxiety disorder: Secondary | ICD-10-CM

## 2014-09-02 MED ORDER — QUETIAPINE FUMARATE 50 MG PO TABS: 50.0000 mg | ORAL_TABLET | Freq: Every day | ORAL | Status: DC

## 2014-09-02 MED ORDER — SERTRALINE HCL 100 MG PO TABS: ORAL_TABLET | ORAL | Status: DC

## 2014-09-02 NOTE — Progress Notes (Addendum)
[facility] [phone] Progress Note  Anna Melendez [id number] 43 y.o.   Chief Complaint:  Medication management and followup.        History of Present Illness:  Anna Melendez came for her followup appointment.  She is compliant with Seroquel and trazodone.  She is feeling good.  She denies any irritability, anger, mood swing or any crying spells.  She is sleeping good.  She has gained weight from the past.  She does not want to change her Seroquel because it is helping her depression.  She had blood work done at her primary care physician on August 6.  Her CBC is normal.  Her total cholesterol is 195 and triglyceride 244.  Her TSH is normal  And her BUN and creatinine is also normal.  Her LFTs are normal.  She has no hemoglobin A1c. She is not taking any other medication.  She has no tremors or shakes.  She is happy that her daughter is graduating this year.  She lives with her 3 children.  Patient is separated from her husband.  She denies any paranoia or any hallucination.  Her appetite is okay.  She admitted not watching her calorie intake or doing any exercise.  Suicidal Ideation: No Plan Formed: No Patient has means to carry out plan: No  Homicidal Ideation: No Plan Formed: No Patient has means to carry out plan: No  Review of Systems  Constitutional:       Weight gain, obesity  Skin: Negative for itching and rash.  Neurological: Negative.   Psychiatric/Behavioral: Negative for depression and suicidal ideas.    Psychiatric: Agitation: No Hallucination: No Depressed Mood: No Insomnia: No Hypersomnia: No Altered Concentration: No Feels Worthless: No Grandiose Ideas: No Belief In Special Powers: No New/Increased Substance Abuse: No Compulsions: No  Neurologic: Headache: No Seizure: No Paresthesias: No  Past Medical History:  Patient has history of Ovarian cancer.  Her [facility] is [facility].  She is seeing Dr. Edrick Oh.   Outpatient Encounter Prescriptions  as of 09/02/2014  Medication Sig  . QUEtiapine (SEROQUEL) 50 MG tablet Take 1 tablet (50 mg total) by mouth at bedtime.  . sertraline (ZOLOFT) 100 MG tablet TAKE ONE TABLET BY MOUTH ONCE DAILY  . [DISCONTINUED] QUEtiapine (SEROQUEL) 50 MG tablet Take 1 tablet (50 mg total) by mouth at bedtime.  . [DISCONTINUED] sertraline (ZOLOFT) 100 MG tablet TAKE ONE TABLET BY MOUTH ONCE DAILY    Past Psychiatric History/Hospitalization(s): Patient has intensive outpatient program.  In the past she had tried Wellbutrin.  She was given trazodone and Zoloft when she was in intensive outpatient program.  Later it was added Remeron.  Patient has history of heavy drinking.   Anxiety: Yes Bipolar Disorder: No Depression: Yes Mania: No Psychosis: No Schizophrenia: No Personality Disorder: No Hospitalization for psychiatric illness: No History of Electroconvulsive Shock Therapy: No Prior Suicide Attempts: No  Physical Exam: Constitutional:  BP 139/75 mmHg  Pulse 69  Ht 5\' 4"  (1.626 m)  Wt 202 lb 3.2 oz (91.717 kg)  BMI 34.69 kg/m2  General Appearance: well nourished and obese  Musculoskeletal: Strength & Muscle Tone: within normal limits Gait & Station: normal Patient leans: N/A  Psychiatric: Speech (describe rate, volume, coherence, spontaneity, and abnormalities if any): Fast but clear and coherent.      Thought Process (describe rate, content, abstract reasoning, and computation): Within normal limits  Associations: Intact, her fund of knowledge is adequate  Thoughts: normal  Mental Status: Orientation: oriented to person,  place, time/date and situation Mood & Affect: normal affect Attention Span & Concentration: Fair.    Established Problem, Stable/Improving (1), Review of Psycho-Social Stressors (1), Review or order clinical lab tests (1), New Problem, with no additional work-up planned (3), Review of Last Therapy Session (1) and Review of Medication Regimen & Side Effects  (2)  Assessment: Axis I: Disorder NOS, rule out major depressive disorder.  Axis II: Deferred  Axis III: History of gestational diabetes and ovarian cancer  Axis IV: Moderate  Axis V: 65   Plan:  I have a long discussion with the patient about his weight gain.  I encouraged to start weight loss program.  Patient does not want to change her medication.  At this time I will continue Zoloft 100 mg daily and Seroquel 50 mg at bedtime.  I review her blood work results.  If patient continues to gain weight I have suggested to try a different medication on her next visit.  Patient acknowledged and she promised that she will work on her weight loss program.  I will see her again in 3 months.  Recommended to call us back if she has any question concerns or if she feel worsening of the symptoms. Time spent 25 minutes.  More than 50% of the time spent in psychoeducation, counseling and coordination of care.  Discuss safety plan that anytime having active suicidal thoughts or homicidal thoughts then patient need to call 911 or go to the local emergency room.  Shawnta Zimbelman T., MD 09/02/2014

## 2014-12-03 ENCOUNTER — Ambulatory Visit (HOSPITAL_COMMUNITY): Payer: Self-pay | Admitting: Psychiatry

## 2014-12-12 ENCOUNTER — Ambulatory Visit (INDEPENDENT_AMBULATORY_CARE_PROVIDER_SITE_OTHER): Payer: Self-pay | Admitting: Psychiatry

## 2014-12-12 ENCOUNTER — Encounter (HOSPITAL_COMMUNITY): Payer: Self-pay | Admitting: Psychiatry

## 2014-12-12 VITALS — BP 132/82 | HR 80 | Ht 64.0 in | Wt 198.8 lb

## 2014-12-12 DIAGNOSIS — F411 Generalized anxiety disorder: Secondary | ICD-10-CM

## 2014-12-12 MED ORDER — SERTRALINE HCL 100 MG PO TABS: ORAL_TABLET | ORAL | Status: DC

## 2014-12-12 MED ORDER — QUETIAPINE FUMARATE 50 MG PO TABS
50.0000 mg | ORAL_TABLET | Freq: Every day | ORAL | Status: DC
Start: 2014-12-12 — End: 2015-03-12

## 2014-12-12 NOTE — Progress Notes (Addendum)
[facility] [phone] Progress Note  Anna Melendez [id number] 44 y.o.  Chief Complaint:  Medication management and followup.        History of Present Illness:  Anna Melendez came for her followup appointment.  She is taking her medication as prescribed.  She likes her current medication.  She denies any irritability, anger, mood swing.  She is happy that she lost 2 pounds from her last visit.  She had a good Christmas.  She sleeping good.  She denies any side effects of medication.  Patient lives with her 3 children.  She is separated from her husband .  Her appetite is okay.  Her vitals are stable.    Suicidal Ideation: No Plan Formed: No Patient has means to carry out plan: No  Homicidal Ideation: No Plan Formed: No Patient has means to carry out plan: No  ROS  Psychiatric: Agitation: No Hallucination: No Depressed Mood: No Insomnia: No Hypersomnia: No Altered Concentration: No Feels Worthless: No Grandiose Ideas: No Belief In Special Powers: No New/Increased Substance Abuse: No Compulsions: No  Neurologic: Headache: No Seizure: No Paresthesias: No  Past Medical History:  Patient has history of Ovarian cancer.  Her [facility] is [facility].  She is seeing Dr. Edrick Oh.   Outpatient Encounter Prescriptions as of 12/12/2014  Medication Sig  . QUEtiapine (SEROQUEL) 50 MG tablet Take 1 tablet (50 mg total) by mouth at bedtime.  . sertraline (ZOLOFT) 100 MG tablet TAKE ONE TABLET BY MOUTH ONCE DAILY  . [DISCONTINUED] QUEtiapine (SEROQUEL) 50 MG tablet Take 1 tablet (50 mg total) by mouth at bedtime.  . [DISCONTINUED] sertraline (ZOLOFT) 100 MG tablet TAKE ONE TABLET BY MOUTH ONCE DAILY    Past Psychiatric History/Hospitalization(s): Patient has intensive outpatient program.  In the past she had tried Wellbutrin.  She was given trazodone and Zoloft when she was in intensive outpatient program.  Later it was added Remeron.  Patient has history of heavy drinking.    Anxiety: Yes Bipolar Disorder: No Depression: Yes Mania: No Psychosis: No Schizophrenia: No Personality Disorder: No Hospitalization for psychiatric illness: No History of Electroconvulsive Shock Therapy: No Prior Suicide Attempts: No  Physical Exam: Constitutional:  BP 132/82 mmHg  Pulse 80  Ht 5\' 4"  (1.626 m)  Wt 198 lb 12.8 oz (90.175 kg)  BMI 34.11 kg/m2  General Appearance: well nourished and obese  Musculoskeletal: Strength & Muscle Tone: within normal limits Gait & Station: normal Patient leans: N/A  Psychiatric: Speech (describe rate, volume, coherence, spontaneity, and abnormalities if any): Fast but clear and coherent.      Thought Process (describe rate, content, abstract reasoning, and computation): Within normal limits  Associations: Intact, her fund of knowledge is adequate  Thoughts: normal  Mental Status: Orientation: oriented to person, place, time/date and situation Mood & Affect: normal affect Attention Span & Concentration: Fair.    Established Problem, Stable/Improving (1), Review of Last Therapy Session (1) and Review of Medication Regimen & Side Effects (2)  Assessment: Axis I: Disorder NOS, rule out major depressive disorder.  Axis II: Deferred  Axis III: History of gestational diabetes and ovarian cancer  Plan:  Patient is doing better on her current psychotropic medication.  She has no side effects.  I will continue Zoloft 100 mg daily and Seroquel 50 mg at bedtime.  Discussed medication side effects and benefits.  Recommended to call us back if she has any question, concern or if she feels worsening of the symptom.  Follow-up in 3 months.  Caylon Saine T., MD 12/12/2014

## 2015-03-12 ENCOUNTER — Ambulatory Visit (INDEPENDENT_AMBULATORY_CARE_PROVIDER_SITE_OTHER): Payer: Self-pay | Admitting: Psychiatry

## 2015-03-12 ENCOUNTER — Encounter (HOSPITAL_COMMUNITY): Payer: Self-pay | Admitting: Psychiatry

## 2015-03-12 VITALS — BP 120/70 | HR 78 | Ht 64.0 in | Wt 199.8 lb

## 2015-03-12 DIAGNOSIS — F411 Generalized anxiety disorder: Secondary | ICD-10-CM

## 2015-03-12 MED ORDER — SERTRALINE HCL 100 MG PO TABS: ORAL_TABLET | ORAL | Status: AC

## 2015-03-12 MED ORDER — QUETIAPINE FUMARATE 50 MG PO TABS: 50.0000 mg | ORAL_TABLET | Freq: Every day | ORAL | Status: AC

## 2015-03-12 NOTE — Progress Notes (Signed)
[facility] [phone] Progress Note  Anna Melendez [id number] 44 y.o.  Chief Complaint:  Medication management and followup.        History of Present Illness:  Melena came for her followup appointment.   She is very happy because her daughter recently graduated.  She is excited about her. She sleeping good.  She denies any irritability, anger, mood swing or any depressive symptoms.  Her sleep is good. She is compliant with her medication and feel her medicine is working very well. Her appetite is okay.  Her vitals are stable. She denies any feeling of hopelessness or worthlessness.  She denies any crying spells.  Her energy level is good.  She wants to continue her current medication.  She has no issues.  Suicidal Ideation: No Plan Formed: No Patient has means to carry out plan: No  Homicidal Ideation: No Plan Formed: No Patient has means to carry out plan: No  Review of Systems  Constitutional: Negative.   Cardiovascular: Negative.   Musculoskeletal: Negative.   Skin: Negative for itching and rash.  Neurological: Negative for dizziness and tremors.    Psychiatric: Agitation: No Hallucination: No Depressed Mood: No Insomnia: No Hypersomnia: No Altered Concentration: No Feels Worthless: No Grandiose Ideas: No Belief In Special Powers: No New/Increased Substance Abuse: No Compulsions: No  Neurologic: Headache: No Seizure: No Paresthesias: No  Past Medical History:  Patient has history of Ovarian cancer.  [facility].  She is seeing  Dr. Jeannetta Nap   Outpatient Encounter Prescriptions as of 03/12/2015  Medication Sig  . QUEtiapine (SEROQUEL) 50 MG tablet Take 1 tablet (50 mg total) by mouth at bedtime.  . sertraline (ZOLOFT) 100 MG tablet TAKE ONE TABLET BY MOUTH ONCE DAILY  . [DISCONTINUED] QUEtiapine (SEROQUEL) 50 MG tablet Take 1 tablet (50 mg total) by mouth at bedtime.  . [DISCONTINUED] sertraline (ZOLOFT) 100 MG tablet TAKE ONE  TABLET BY MOUTH ONCE DAILY   No facility-administered encounter medications on file as of 03/12/2015.    Past Psychiatric History/Hospitalization(s): Patient has intensive outpatient program.  In the past she had tried Wellbutrin.  She was given trazodone and Zoloft when she was in intensive outpatient program.  Later it was added Remeron.  Patient has history of heavy drinking.   Anxiety: Yes Bipolar Disorder: No Depression: Yes Mania: No Psychosis: No Schizophrenia: No Personality Disorder: No Hospitalization for psychiatric illness: No History of Electroconvulsive Shock Therapy: No Prior Suicide Attempts: No  Physical Exam: Constitutional:  BP 120/70 mmHg  Pulse 78  Ht 5\' 4"  (1.626 m)  Wt 199 lb 12.8 oz (90.629 kg)  BMI 34.28 kg/m2  SpO2 97%  General Appearance: well nourished and obese  Musculoskeletal: Strength & Muscle Tone: within normal limits Gait & Station: normal Patient leans: N/A  Psychiatric: Speech (describe rate, volume, coherence, spontaneity, and abnormalities if any): Fast but clear and coherent.      Thought Process (describe rate, content, abstract reasoning, and computation): Within normal limits  Associations: Intact, her fund of knowledge is adequate  Thoughts: normal  Mental Status: Orientation: oriented to person, place, time/date and situation Mood & Affect: normal affect Attention Span & Concentration: Fair.    Established Problem, Stable/Improving (1), Review of Last Therapy Session (1) and Review of Medication Regimen & Side Effects (2)  Assessment: Axis I: Disorder NOS, rule out major depressive disorder.  Axis II: Deferred  Axis III: History of gestational diabetes and ovarian cancer  Plan:   patient is  a stable on her medication.  She denies any side effects or any concerns.  I will continue Zoloft 100 mg daily and Seroquel 50 mg at bedtime.  Discussed medication side effects and benefits.  We will do blood work on her next  appointment.  Recommended to call us back if she has any question or any concerns.  Follow-up in 3 months.  ARFEEN,SYED T., MD 03/12/2015

## 2015-06-12 ENCOUNTER — Ambulatory Visit (HOSPITAL_COMMUNITY): Payer: Self-pay | Admitting: Psychiatry

## 2016-04-20 ENCOUNTER — Encounter (HOSPITAL_COMMUNITY): Payer: Self-pay | Admitting: *Deleted

## 2016-04-20 ENCOUNTER — Emergency Department (HOSPITAL_COMMUNITY)
Admission: EM | Admit: 2016-04-20 | Discharge: 2016-04-21 | Disposition: A | Discharge status: Home / Self Care | Payer: BLUE CROSS/BLUE SHIELD | Attending: Emergency Medicine | Admitting: Emergency Medicine

## 2016-04-20 ENCOUNTER — Emergency Department (HOSPITAL_COMMUNITY): Payer: BLUE CROSS/BLUE SHIELD

## 2016-04-20 DIAGNOSIS — Z8543 Personal history of malignant neoplasm of ovary: Secondary | ICD-10-CM | POA: Diagnosis not present

## 2016-04-20 DIAGNOSIS — S52515A Nondisplaced fracture of left radial styloid process, initial encounter for closed fracture: Secondary | ICD-10-CM | POA: Insufficient documentation

## 2016-04-20 DIAGNOSIS — Y929 Unspecified place or not applicable: Secondary | ICD-10-CM | POA: Diagnosis not present

## 2016-04-20 DIAGNOSIS — W19XXXA Unspecified fall, initial encounter: Secondary | ICD-10-CM | POA: Insufficient documentation

## 2016-04-20 DIAGNOSIS — F329 Major depressive disorder, single episode, unspecified: Secondary | ICD-10-CM | POA: Insufficient documentation

## 2016-04-20 DIAGNOSIS — Y999 Unspecified external cause status: Secondary | ICD-10-CM | POA: Diagnosis not present

## 2016-04-20 DIAGNOSIS — F17210 Nicotine dependence, cigarettes, uncomplicated: Secondary | ICD-10-CM | POA: Insufficient documentation

## 2016-04-20 DIAGNOSIS — Y939 Activity, unspecified: Secondary | ICD-10-CM | POA: Diagnosis not present

## 2016-04-20 DIAGNOSIS — M25539 Pain in unspecified wrist: Secondary | ICD-10-CM | POA: Diagnosis not present

## 2016-04-20 DIAGNOSIS — S62102A Fracture of unspecified carpal bone, left wrist, initial encounter for closed fracture: Secondary | ICD-10-CM | POA: Diagnosis not present

## 2016-04-20 DIAGNOSIS — M25532 Pain in left wrist: Secondary | ICD-10-CM | POA: Diagnosis present

## 2016-04-20 DIAGNOSIS — M21932 Unspecified acquired deformity of left forearm: Secondary | ICD-10-CM | POA: Diagnosis not present

## 2016-04-20 DIAGNOSIS — S52512A Displaced fracture of left radial styloid process, initial encounter for closed fracture: Secondary | ICD-10-CM

## 2016-04-20 MED ORDER — OXYCODONE-ACETAMINOPHEN 5-325 MG PO TABS: 1.0000 | ORAL_TABLET | Freq: Four times a day (QID) | ORAL | Status: AC | PRN

## 2016-04-20 MED ORDER — NAPROXEN 500 MG PO TABS: 500.0000 mg | ORAL_TABLET | Freq: Two times a day (BID) | ORAL | Status: AC

## 2016-04-20 MED ORDER — OXYCODONE-ACETAMINOPHEN 5-325 MG PO TABS
2.0000 | ORAL_TABLET | Freq: Once | ORAL | Status: AC
  Administered 2016-04-20: 2 via ORAL
  Filled 2016-04-20: qty 2

## 2016-04-20 NOTE — ED Notes (Addendum)
Pt in xray

## 2016-04-20 NOTE — ED Provider Notes (Signed)
CSN: [id number]     Arrival date & time 04/20/16  2112 History   First MD Initiated Contact with Patient 04/20/16 2249     Chief Complaint  Patient presents with  . Wrist Pain     (Consider location/radiation/quality/duration/timing/severity/associated sxs/prior Treatment) HPI Comments: 45 year old female with a history of anxiety, metabolic disorder, depression, gestational diabetes, and ovarian cancer presents to the emergency department for evaluation of left wrist pain secondary to a fall which occurred at approximately 1730. Patient states that she went to urgent care at around 1900 who were concerned about possible fracture due to a subjective deformity to the left wrist. Patient was placed in a splint at urgent care and sent to the emergency department for evaluation as the urgent care did not have an x-ray machine available. Patient reports icing her wrist prior to urgent care. She notes that her pain was worsening throughout the evening.  Patient is a 45 y.o. female presenting with wrist pain. The history is provided by the patient. No language interpreter was used.  Wrist Pain Associated symptoms include arthralgias and joint swelling. Pertinent negatives include no numbness or weakness.    Past Medical History  Diagnosis Date  . Anxiety   . Mental disorder   . Depression   . Gestational diabetes   . Ovarian cancer ([place]) 1994  . Heartburn    Past Surgical History  Procedure Laterality Date  . Cervical fusion  1991  . Ovary surgery  1994    Hx cancer   Family History  Problem Relation Age of Onset  . Bipolar disorder Father   . Alcohol abuse Father   . Bipolar disorder Sister   . Alcohol abuse Sister   . Bipolar disorder Mother   . Bipolar disorder Paternal Aunt    Social History  Substance Use Topics  . Smoking status: Current Every Day Smoker -- 2.00 packs/day for 30 years    Types: Cigarettes  . Smokeless tobacco: None  . Alcohol Use: 0.0 oz/week    0  Standard drinks or equivalent per week     Comment: reports occasional use 1-2x per year   OB History    No data available      Review of Systems  Musculoskeletal: Positive for joint swelling and arthralgias.  Neurological: Negative for weakness and numbness.  All other systems reviewed and are negative.   Allergies  Lamictal  Home Medications   Prior to Admission medications   Medication Sig Start Date End Date Taking? Authorizing Provider  QUEtiapine (SEROQUEL) 50 MG tablet Take 1 tablet (50 mg total) by mouth at bedtime. 03/12/15   Kathlee Nations, MD  sertraline (ZOLOFT) 100 MG tablet TAKE ONE TABLET BY MOUTH ONCE DAILY 03/12/15   Kathlee Nations, MD   BP 150/89 mmHg  Pulse 82  Temp(Src) 98.3 F (36.8 C) (Oral)  Resp 18  Ht 5\' 4"  (1.626 m)  Wt 85.276 kg  BMI 32.25 kg/m2  SpO2 97%   Physical Exam  Constitutional: She is oriented to person, place, and time. She appears well-developed and well-nourished. No distress.  Nontoxic-appearing  HENT:  Head: Normocephalic and atraumatic.  Eyes: Conjunctivae and EOM are normal. No scleral icterus.  Neck: Normal range of motion.  Cardiovascular:  Capillary refill brisk in all digits of left hand.   Pulmonary/Chest: Effort normal. No respiratory distress.  Respirations even and unlabored  Musculoskeletal:  Sugar tong splint noted to the distal left upper extremity and wrist.  Neurological: She is  alert and oriented to person, place, and time. She exhibits normal muscle tone. Coordination normal.  Sensation to light touch intact in all digits of left hand. Patient able to wiggle all fingers.  Skin: Skin is warm and dry. No rash noted. She is not diaphoretic. No erythema. No pallor.  Psychiatric: She has a normal mood and affect. Her behavior is normal.  Nursing note and vitals reviewed.   ED Course  Procedures (including critical care time) Labs Review Labs Reviewed - No data to display  Imaging Review Dg Wrist Complete  Left  04/20/2016  CLINICAL DATA:  Left wrist pain after falling in the yard today. EXAM: LEFT WRIST - COMPLETE 3+ VIEW COMPARISON:  None. FINDINGS: Images obtained through fiberglass demonstrate possible nondisplaced radial styloid fracture. No displaced fracture is evident. There is no dislocation. IMPRESSION: Equivocal nondisplaced radial styloid fracture. Detail obscured by superimposed fiberglass. Electronically Signed   By: Andreas Newport M.D.   On: 04/20/2016 23:18     I have personally reviewed and evaluated these images and lab results as part of my medical decision-making.   EKG Interpretation None      MDM   Final diagnoses:  Fracture of styloid process of radius, left, closed, initial encounter    45 year old female presents to the emergency department for evaluation of left wrist pain. Patient is left-hand dominant. Injury occurred secondary to a fall at 1730 today. Patient presented to urgent care prior to the ED where a sugar tong splint was placed. She was sent to the ED for x-ray as urgent care did not have this capability. Patient neurovascularly intact on my examination. X-ray does show evidence of nondisplaced radial styloid fracture. Orthopedic tech to confirm appropriate splinting and placement. Will refer to orthopedic hand surgery for follow-up. Return precautions discussed and provided. Patient discharged in satisfactory condition with no unaddressed concerns.    Antonietta Breach, PA-C 04/20/16 2349  Gareth Morgan, MD 04/25/16 [phone]

## 2016-04-20 NOTE — ED Notes (Signed)
Pt states she fell today, c/o pain in the left wrist. PT went to urgent care in [place] prior who sent her here because they did not have xray machine. They applied splint prior to departure from their facility.

## 2016-04-20 NOTE — ED Notes (Signed)
Ortho paged for splint 

## 2016-04-20 NOTE — Discharge Instructions (Signed)
Radial Fracture  A radial fracture is a break in the radius bone, which is the long bone of the forearm that is on the same side as your thumb. Your forearm is the part of your arm that is between your elbow and your wrist. It is made up of two bones: the radius and the ulna.  Most radial fractures occur near the wrist (distal radialfracture) or near the elbow (radial head fracture). A distal radial fracture is the most common type of broken arm. This fracture usually occurs about an inch above the wrist. Fractures of the middle part of the bone are less common.  CAUSES   Falling with your arm outstretched is the most common cause of a radial fracture. Other causes include:   Car accidents.   Bike accidents.   A direct blow to the middle part of the radius.  RISK FACTORS   You may be at greater risk for a distal radial fracture if you are 60 years of age or older.   You may be at greater risk for a radial head fracture if you are:    Female.    30-40 years old.   You may be at a greater risk for all types of radial fractures if you have a condition that causes your bones to be weak or thin (osteoporosis).  SIGNS AND SYMPTOMS  A radial fracture causes pain immediately after the injury. Other signs and symptoms include:   An abnormal bend or bump in your arm (deformity).   Swelling.   Bruising.   Numbness or tingling.   Tenderness.   Limited movement.  DIAGNOSIS   Your health care provider may diagnose a radial fracture based on:   Your symptoms.   Your medical history, including any recent injury.   A physical exam. Your health care provider will look for any deformity and feel for tenderness over the break. Your health care provider will also check whether the bone is out of place.   An X-ray exam to confirm the diagnosis and learn more about the type of fracture.  TREATMENT  The goals of treatment are to get the bone in proper position for healing and to keep it from moving so it will heal over  time. Your treatment will depend on many factors, especially the type of fracture that you have.   If the fractured bone:    Is in the correct position (nondisplaced), you may only need to wear a cast or a splint.    Has a slightly displaced fracture, you may need to have the bones moved back into place manually (closed reduction) before the splint or cast is put on.   You may have a temporary splint before you have a plaster cast. The splint allows room for some swelling. After a few days, a cast can replace the splint.    You may have to wear the cast for about 6 weeks or as directed by your health care provider.    The cast may be changed after about 3 weeks or as directed by your health care provider.   After your cast is taken off, you may need physical therapy to regain full movement in your wrist or elbow.   You may need emergency surgery if you have:    A fractured bone that is out of position (displaced).    A fracture with multiple fragments (comminuted fracture).    A fracture that breaks the skin (open fracture). This type of   fracture may require surgical wires, plates, or screws to hold the bone in place.   You may have X-rays every couple of weeks to check on your healing.  HOME CARE INSTRUCTIONS   Keep the injured arm above the level of your heart while you are sitting or lying down. This helps to reduce swelling and pain.   Apply ice to the injured area:    Put ice in a plastic bag.    Place a towel between your skin and the bag.    Leave the ice on for 20 minutes, 2-3 times per day.   Move your fingers often to avoid stiffness and to minimize swelling.   If you have a plaster or fiberglass cast:    Do not try to scratch the skin under the cast using sharp or pointed objects.    Check the skin around the cast every day. You may put lotion on any red or sore areas.    Keep your cast dry and clean.   If you have a plaster splint:    Wear the splint as directed.    Loosen the elastic around  the splint if your fingers become numb and tingle, or if they turn cold and blue.   Do not put pressure on any part of your cast until it is fully hardened. Rest your cast only on a pillow for the first 24 hours.   Protect your cast or splint while bathing or showering, as directed by your health care provider. Do not put your cast or splint into water.   Take medicines only as directed by your health care provider.   Return to activities, such as sports, as directed by your health care provider. Ask your health care provider what activities are safe for you.   Keep all follow-up visits as directed by your health care provider. This is important.  SEEK MEDICAL CARE IF:   Your pain medicine is not helping.   Your cast gets damaged or it breaks.   Your cast becomes loose.   Your cast gets wet.   You have more severe pain or swelling than you did before the cast.   You have severe pain when stretching your fingers.   You continue to have pain or stiffness in your elbow or your wrist after your cast is taken off.  SEEK IMMEDIATE MEDICAL CARE IF:   You cannot move your fingers.   You lose feeling in your fingers or your hand.   Your hand or your fingers turn cold and pale or blue.   You notice a bad smell coming from your cast.   You have drainage from underneath your cast.   You have new stains from blood or drainage seeping through your cast.     This information is not intended to replace advice given to you by your health care provider. Make sure you discuss any questions you have with your health care provider.     Document Released: 03/24/2006 Document Revised: 11/01/2014 Document Reviewed: 04/05/2014  [employer] Patient Education 2016 [employer]

## 2016-04-23 DIAGNOSIS — S52515A Nondisplaced fracture of left radial styloid process, initial encounter for closed fracture: Secondary | ICD-10-CM | POA: Diagnosis not present

## 2016-11-29 ENCOUNTER — Telehealth: Payer: Self-pay | Admitting: General Practice

## 2016-12-10 ENCOUNTER — Ambulatory Visit: Payer: Self-pay | Admitting: Physician Assistant

## 2017-08-10 DIAGNOSIS — N8110 Cystocele, unspecified: Secondary | ICD-10-CM | POA: Diagnosis not present

## 2017-08-10 DIAGNOSIS — N760 Acute vaginitis: Secondary | ICD-10-CM | POA: Diagnosis not present

## 2017-08-10 DIAGNOSIS — Z6830 Body mass index (BMI) 30.0-30.9, adult: Secondary | ICD-10-CM | POA: Diagnosis not present

## 2018-01-09 IMAGING — DX DG WRIST COMPLETE 3+V*L*
4 series · 4 of 4 positions shown · non-contrast
Comparison: None.

CLINICAL DATA: Left wrist pain after falling in the yard today.

EXAM:
LEFT WRIST - COMPLETE 3+ VIEW

[wrist pa]
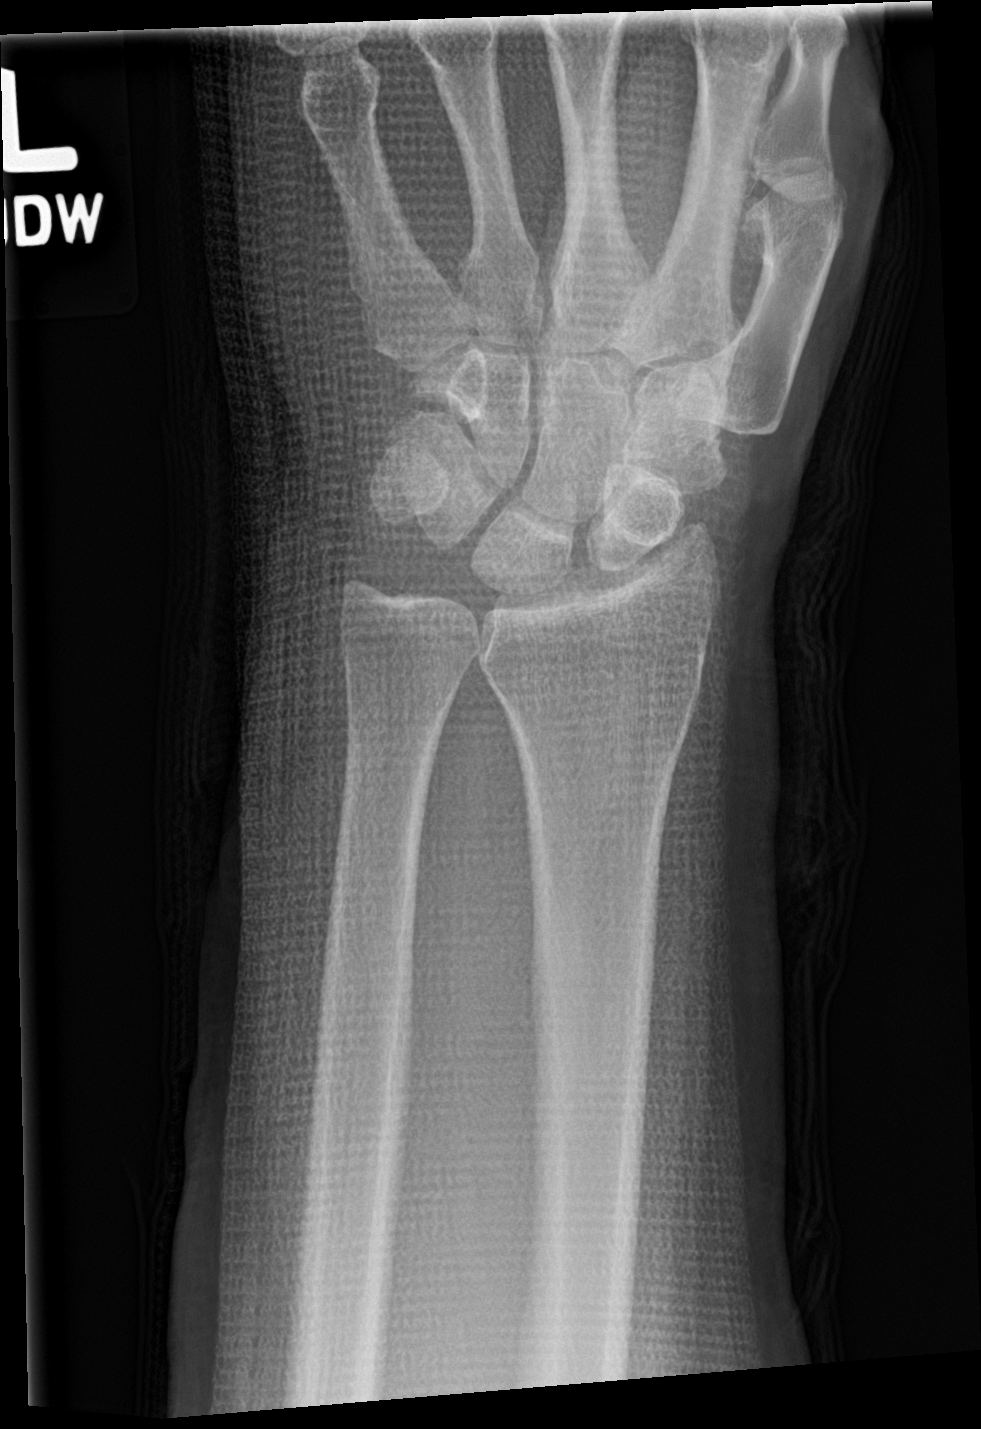

[wrist obl]
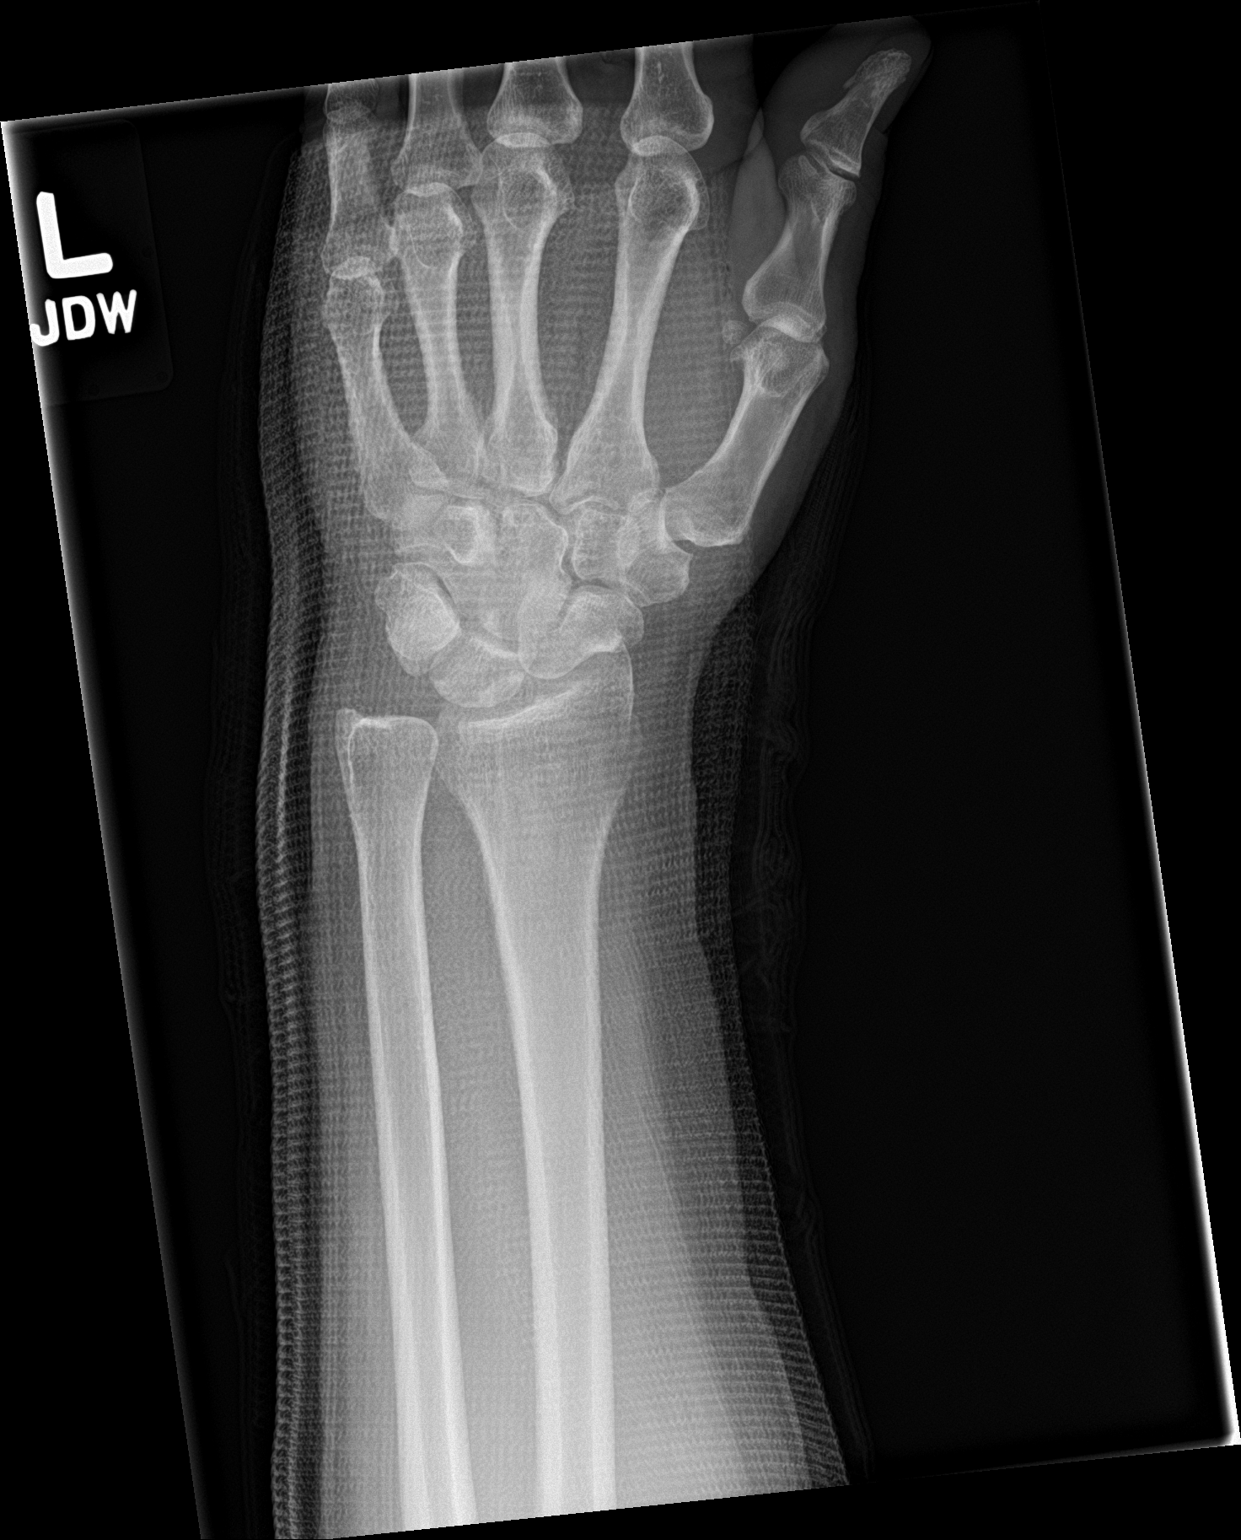

[wrist lat]
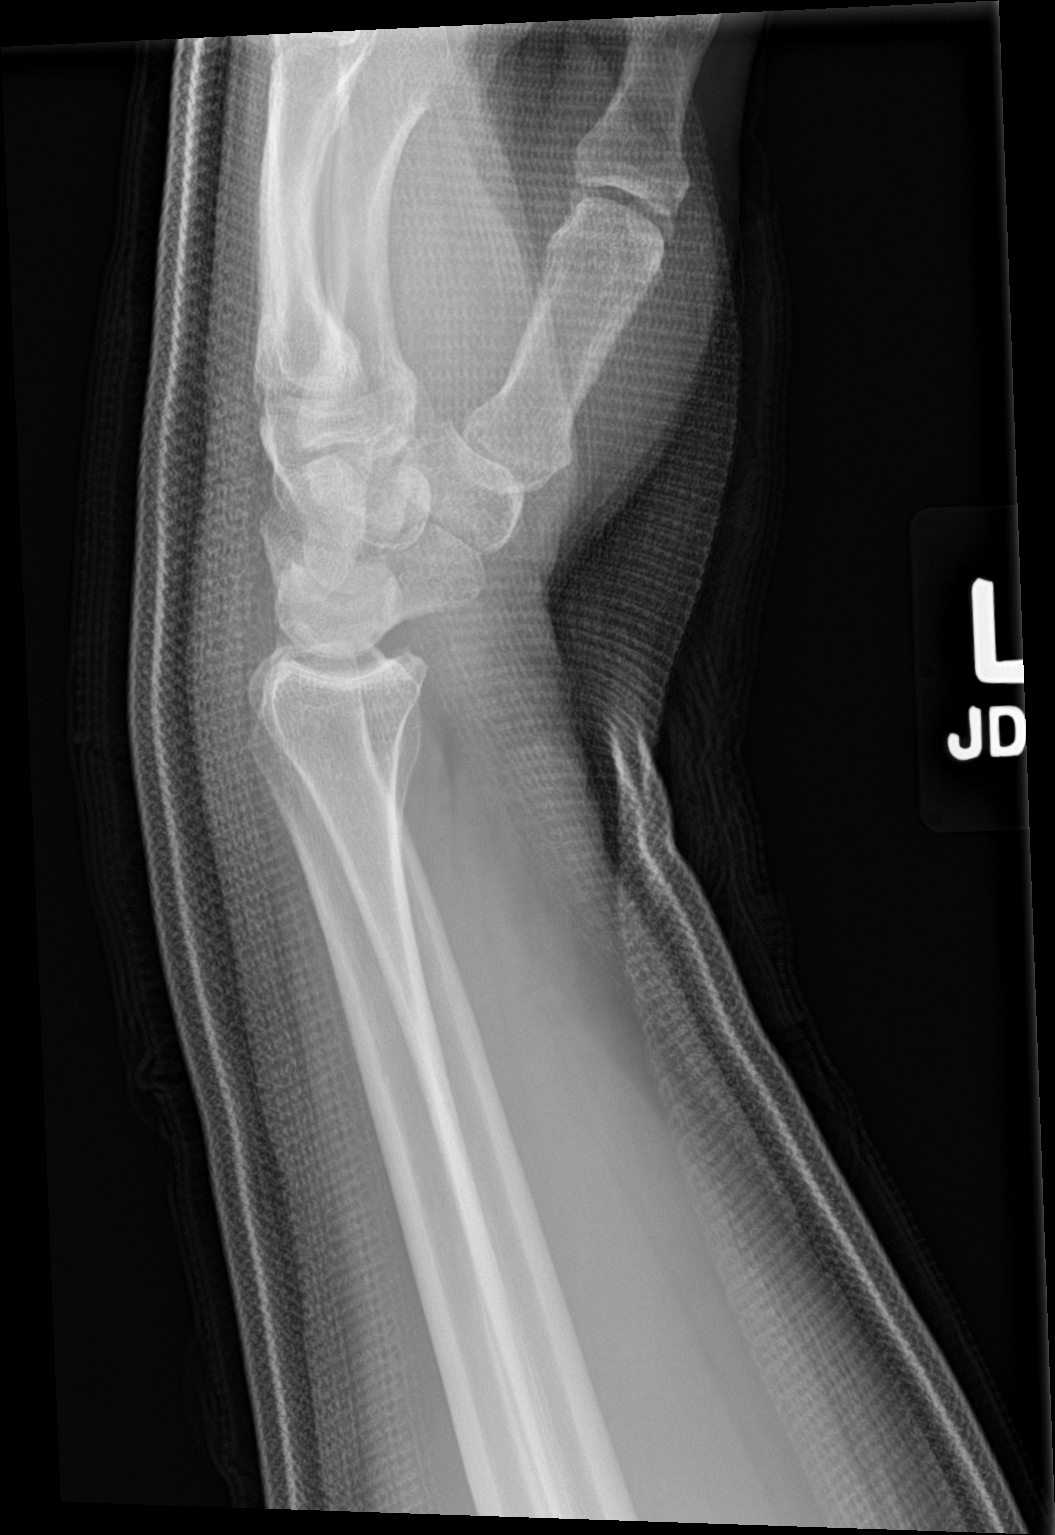

[wrist navicular]
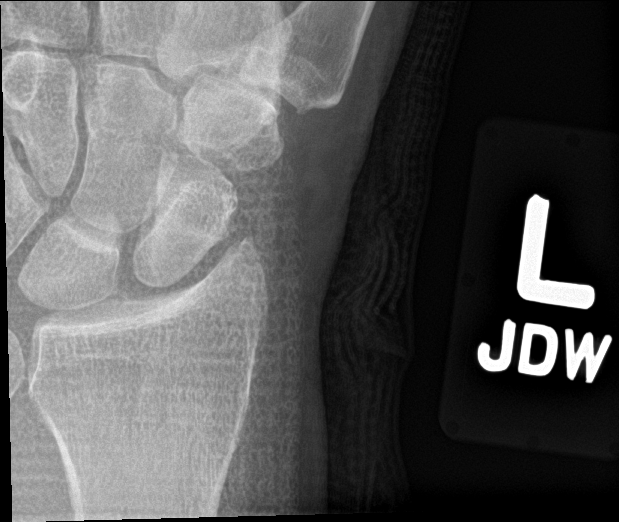

[4 of 4 positions shown; findings below may reference images not displayed]

FINDINGS: Images obtained through fiberglass demonstrate possible nondisplaced
radial styloid fracture. No displaced fracture is evident. There is
no dislocation.
IMPRESSION: Equivocal nondisplaced radial styloid fracture. Detail obscured by
superimposed fiberglass.

## 2018-10-03 DIAGNOSIS — S92352A Displaced fracture of fifth metatarsal bone, left foot, initial encounter for closed fracture: Secondary | ICD-10-CM | POA: Diagnosis not present

## 2018-10-03 DIAGNOSIS — S93402A Sprain of unspecified ligament of left ankle, initial encounter: Secondary | ICD-10-CM | POA: Diagnosis not present

## 2018-10-03 DIAGNOSIS — M25532 Pain in left wrist: Secondary | ICD-10-CM | POA: Diagnosis not present

## 2018-10-03 DIAGNOSIS — S6992XA Unspecified injury of left wrist, hand and finger(s), initial encounter: Secondary | ICD-10-CM | POA: Diagnosis not present

## 2018-10-03 DIAGNOSIS — S99912A Unspecified injury of left ankle, initial encounter: Secondary | ICD-10-CM | POA: Diagnosis not present

## 2018-10-03 DIAGNOSIS — S92592A Other fracture of left lesser toe(s), initial encounter for closed fracture: Secondary | ICD-10-CM | POA: Diagnosis not present

## 2018-10-03 DIAGNOSIS — M25572 Pain in left ankle and joints of left foot: Secondary | ICD-10-CM | POA: Diagnosis not present

## 2018-10-03 DIAGNOSIS — M79672 Pain in left foot: Secondary | ICD-10-CM | POA: Diagnosis not present

## 2018-10-03 DIAGNOSIS — W19XXXA Unspecified fall, initial encounter: Secondary | ICD-10-CM | POA: Diagnosis not present

## 2018-10-03 DIAGNOSIS — S92902A Unspecified fracture of left foot, initial encounter for closed fracture: Secondary | ICD-10-CM | POA: Diagnosis not present

## 2018-10-03 DIAGNOSIS — S63502A Unspecified sprain of left wrist, initial encounter: Secondary | ICD-10-CM | POA: Diagnosis not present

## 2018-11-03 DIAGNOSIS — S92352K Displaced fracture of fifth metatarsal bone, left foot, subsequent encounter for fracture with nonunion: Secondary | ICD-10-CM | POA: Diagnosis not present

## 2018-11-27 DIAGNOSIS — S92352D Displaced fracture of fifth metatarsal bone, left foot, subsequent encounter for fracture with routine healing: Secondary | ICD-10-CM | POA: Diagnosis not present

## 2018-12-25 DIAGNOSIS — S92352D Displaced fracture of fifth metatarsal bone, left foot, subsequent encounter for fracture with routine healing: Secondary | ICD-10-CM | POA: Diagnosis not present

## 2019-05-10 DIAGNOSIS — Z1159 Encounter for screening for other viral diseases: Secondary | ICD-10-CM | POA: Diagnosis not present
